# Patient Record
Sex: Female | Born: 1985 | Race: White | Hispanic: No | Marital: Married | Smoking: Current every day smoker
Health system: Southern US, Community
[De-identification: ages and names within clinical notes are randomized; demographics above are authoritative.]

## PROBLEM LIST (undated history)

## (undated) ENCOUNTER — Inpatient Hospital Stay: Payer: Self-pay

## (undated) DIAGNOSIS — K219 Gastro-esophageal reflux disease without esophagitis: Secondary | ICD-10-CM

## (undated) DIAGNOSIS — G2581 Restless legs syndrome: Secondary | ICD-10-CM

---

## 2011-02-25 LAB — HM PAP SMEAR: HM Pap smear: NORMAL

## 2011-03-23 LAB — OB RESULTS CONSOLE ABO/RH

## 2011-03-23 LAB — OB RESULTS CONSOLE RUBELLA ANTIBODY, IGM: Rubella: IMMUNE

## 2011-03-23 LAB — OB RESULTS CONSOLE GC/CHLAMYDIA: Gonorrhea: NEGATIVE

## 2011-03-23 LAB — OB RESULTS CONSOLE ANTIBODY SCREEN: Antibody Screen: NEGATIVE

## 2011-08-25 ENCOUNTER — Encounter (HOSPITAL_COMMUNITY): Payer: Self-pay | Admitting: *Deleted

## 2011-08-25 ENCOUNTER — Telehealth (HOSPITAL_COMMUNITY): Payer: Self-pay | Admitting: *Deleted

## 2011-08-25 NOTE — Telephone Encounter (Signed)
Preadmission screen  

## 2011-08-27 ENCOUNTER — Encounter (HOSPITAL_COMMUNITY): Payer: Self-pay | Admitting: Anesthesiology

## 2011-08-27 ENCOUNTER — Encounter (HOSPITAL_COMMUNITY)
Admission: AD | Disposition: A | Discharge status: Home / Self Care | Payer: Self-pay | Source: Ambulatory Visit | Attending: Obstetrics and Gynecology

## 2011-08-27 ENCOUNTER — Encounter (HOSPITAL_COMMUNITY): Payer: Self-pay | Admitting: *Deleted

## 2011-08-27 ENCOUNTER — Encounter (HOSPITAL_COMMUNITY): Payer: Self-pay

## 2011-08-27 ENCOUNTER — Inpatient Hospital Stay (HOSPITAL_COMMUNITY): Payer: Medicaid Other | Admitting: Anesthesiology

## 2011-08-27 ENCOUNTER — Inpatient Hospital Stay (HOSPITAL_COMMUNITY)
Admission: AD | Admit: 2011-08-27 | Discharge: 2011-08-30 | DRG: 766 | Disposition: A | Discharge status: Home / Self Care | Payer: Medicaid Other | Source: Ambulatory Visit | Attending: Obstetrics and Gynecology | Admitting: Obstetrics and Gynecology

## 2011-08-27 DIAGNOSIS — Z98891 History of uterine scar from previous surgery: Secondary | ICD-10-CM

## 2011-08-27 LAB — CBC
HCT: 32.6 % — ABNORMAL LOW (ref 36.0–46.0)
MCH: 29.4 pg (ref 26.0–34.0)
MCV: 87.2 fL (ref 78.0–100.0)
Platelets: 249 10*3/uL (ref 150–400)
RBC: 3.74 MIL/uL — ABNORMAL LOW (ref 3.87–5.11)
WBC: 15.9 10*3/uL — ABNORMAL HIGH (ref 4.0–10.5)

## 2011-08-27 LAB — TYPE AND SCREEN
ABO/RH(D): B POS
Antibody Screen: NEGATIVE

## 2011-08-27 SURGERY — Surgical Case
Anesthesia: Regional

## 2011-08-27 MED ORDER — OXYTOCIN 10 UNIT/ML IJ SOLN
INTRAMUSCULAR | Status: DC | PRN
  Administered 2011-08-27 (×2): 40 [IU] via INTRAMUSCULAR

## 2011-08-27 MED ORDER — ONDANSETRON HCL 4 MG/2ML IJ SOLN
INTRAMUSCULAR | Status: DC | PRN
  Administered 2011-08-27: 4 mg via INTRAVENOUS

## 2011-08-27 MED ORDER — PHENYLEPHRINE 40 MCG/ML (10ML) SYRINGE FOR IV PUSH (FOR BLOOD PRESSURE SUPPORT)
PREFILLED_SYRINGE | INTRAVENOUS | Status: AC
  Filled 2011-08-27: qty 5

## 2011-08-27 MED ORDER — FENTANYL CITRATE 0.05 MG/ML IJ SOLN: 25.0000 ug | INTRAMUSCULAR | Status: DC | PRN

## 2011-08-27 MED ORDER — IBUPROFEN 600 MG PO TABS: 600.0000 mg | ORAL_TABLET | Freq: Four times a day (QID) | ORAL | Status: DC | PRN

## 2011-08-27 MED ORDER — NALOXONE HCL 0.4 MG/ML IJ SOLN: 0.4000 mg | INTRAMUSCULAR | Status: DC | PRN

## 2011-08-27 MED ORDER — MORPHINE SULFATE 0.5 MG/ML IJ SOLN
INTRAMUSCULAR | Status: AC
  Filled 2011-08-27: qty 10

## 2011-08-27 MED ORDER — FENTANYL 2.5 MCG/ML BUPIVACAINE 1/10 % EPIDURAL INFUSION (WH - ANES)
INTRAMUSCULAR | Status: AC
  Filled 2011-08-27: qty 60

## 2011-08-27 MED ORDER — ACETAMINOPHEN 325 MG PO TABS: 650.0000 mg | ORAL_TABLET | ORAL | Status: DC | PRN

## 2011-08-27 MED ORDER — OXYCODONE-ACETAMINOPHEN 5-325 MG PO TABS
1.0000 | ORAL_TABLET | ORAL | Status: DC | PRN
  Administered 2011-08-29 – 2011-08-30 (×3): 1 via ORAL
  Filled 2011-08-27 (×3): qty 1

## 2011-08-27 MED ORDER — SCOPOLAMINE 1 MG/3DAYS TD PT72
MEDICATED_PATCH | TRANSDERMAL | Status: AC
  Administered 2011-08-27: 1.5 mg via TRANSDERMAL
  Filled 2011-08-27: qty 1

## 2011-08-27 MED ORDER — ONDANSETRON HCL 4 MG PO TABS: 4.0000 mg | ORAL_TABLET | ORAL | Status: DC | PRN

## 2011-08-27 MED ORDER — DIPHENHYDRAMINE HCL 50 MG/ML IJ SOLN: 25.0000 mg | INTRAMUSCULAR | Status: DC | PRN

## 2011-08-27 MED ORDER — LACTATED RINGERS IV SOLN
INTRAVENOUS | Status: DC | PRN
  Administered 2011-08-27: 20:00:00 via INTRAVENOUS

## 2011-08-27 MED ORDER — OXYCODONE-ACETAMINOPHEN 5-325 MG PO TABS: 1.0000 | ORAL_TABLET | ORAL | Status: DC | PRN

## 2011-08-27 MED ORDER — PHENYLEPHRINE 40 MCG/ML (10ML) SYRINGE FOR IV PUSH (FOR BLOOD PRESSURE SUPPORT): 80.0000 ug | PREFILLED_SYRINGE | INTRAVENOUS | Status: DC | PRN

## 2011-08-27 MED ORDER — METOCLOPRAMIDE HCL 5 MG/ML IJ SOLN: 10.0000 mg | Freq: Three times a day (TID) | INTRAMUSCULAR | Status: DC | PRN

## 2011-08-27 MED ORDER — FENTANYL 2.5 MCG/ML BUPIVACAINE 1/10 % EPIDURAL INFUSION (WH - ANES)
14.0000 mL/h | INTRAMUSCULAR | Status: DC
  Administered 2011-08-27: 14 mL/h via EPIDURAL
  Filled 2011-08-27: qty 60

## 2011-08-27 MED ORDER — LIDOCAINE HCL (PF) 1 % IJ SOLN
INTRAMUSCULAR | Status: DC | PRN
  Administered 2011-08-27 (×2): 4 mL

## 2011-08-27 MED ORDER — OXYTOCIN 10 UNIT/ML IJ SOLN
INTRAMUSCULAR | Status: AC
  Filled 2011-08-27: qty 4

## 2011-08-27 MED ORDER — LACTATED RINGERS IV SOLN
INTRAVENOUS | Status: DC
  Administered 2011-08-27: 20:00:00 via INTRAVENOUS

## 2011-08-27 MED ORDER — IBUPROFEN 600 MG PO TABS
600.0000 mg | ORAL_TABLET | Freq: Four times a day (QID) | ORAL | Status: DC | PRN
  Filled 2011-08-27 (×6): qty 1

## 2011-08-27 MED ORDER — SODIUM CHLORIDE 0.9 % IV SOLN
1.0000 ug/kg/h | INTRAVENOUS | Status: DC | PRN
  Filled 2011-08-27: qty 2.5

## 2011-08-27 MED ORDER — NALBUPHINE HCL 10 MG/ML IJ SOLN
5.0000 mg | INTRAMUSCULAR | Status: DC | PRN
  Filled 2011-08-27 (×2): qty 1

## 2011-08-27 MED ORDER — EPHEDRINE 5 MG/ML INJ: 10.0000 mg | INTRAVENOUS | Status: DC | PRN

## 2011-08-27 MED ORDER — FENTANYL 2.5 MCG/ML BUPIVACAINE 1/10 % EPIDURAL INFUSION (WH - ANES)
INTRAMUSCULAR | Status: DC | PRN
  Administered 2011-08-27: 14 mL/h via EPIDURAL

## 2011-08-27 MED ORDER — DIBUCAINE 1 % RE OINT: 1.0000 "application " | TOPICAL_OINTMENT | RECTAL | Status: DC | PRN

## 2011-08-27 MED ORDER — DIPHENHYDRAMINE HCL 25 MG PO CAPS: 25.0000 mg | ORAL_CAPSULE | Freq: Four times a day (QID) | ORAL | Status: DC | PRN

## 2011-08-27 MED ORDER — FENTANYL CITRATE 0.05 MG/ML IJ SOLN
INTRAMUSCULAR | Status: DC | PRN
  Administered 2011-08-27: 100 ug via INTRAVENOUS

## 2011-08-27 MED ORDER — IBUPROFEN 600 MG PO TABS
600.0000 mg | ORAL_TABLET | Freq: Four times a day (QID) | ORAL | Status: DC
  Administered 2011-08-28 – 2011-08-30 (×9): 600 mg via ORAL
  Filled 2011-08-27 (×3): qty 1

## 2011-08-27 MED ORDER — DIPHENHYDRAMINE HCL 50 MG/ML IJ SOLN: 12.5000 mg | INTRAMUSCULAR | Status: DC | PRN

## 2011-08-27 MED ORDER — LACTATED RINGERS IV SOLN
500.0000 mL | INTRAVENOUS | Status: DC | PRN
Start: 2011-08-27 — End: 2011-08-27
  Administered 2011-08-27: 500 mL via INTRAVENOUS

## 2011-08-27 MED ORDER — KETOROLAC TROMETHAMINE 30 MG/ML IJ SOLN: 30.0000 mg | Freq: Four times a day (QID) | INTRAMUSCULAR | Status: AC | PRN

## 2011-08-27 MED ORDER — PHENYLEPHRINE HCL 10 MG/ML IJ SOLN
INTRAMUSCULAR | Status: DC | PRN
  Administered 2011-08-27 (×2): 40 ug via INTRAVENOUS

## 2011-08-27 MED ORDER — WITCH HAZEL-GLYCERIN EX PADS: 1.0000 "application " | MEDICATED_PAD | CUTANEOUS | Status: DC | PRN

## 2011-08-27 MED ORDER — ONDANSETRON HCL 4 MG/2ML IJ SOLN: 4.0000 mg | INTRAMUSCULAR | Status: DC | PRN

## 2011-08-27 MED ORDER — KETOROLAC TROMETHAMINE 30 MG/ML IJ SOLN
30.0000 mg | Freq: Four times a day (QID) | INTRAMUSCULAR | Status: AC | PRN
  Administered 2011-08-28: 30 mg via INTRAVENOUS
  Filled 2011-08-27: qty 1

## 2011-08-27 MED ORDER — ONDANSETRON HCL 4 MG/2ML IJ SOLN: 4.0000 mg | Freq: Three times a day (TID) | INTRAMUSCULAR | Status: DC | PRN

## 2011-08-27 MED ORDER — SIMETHICONE 80 MG PO CHEW
80.0000 mg | CHEWABLE_TABLET | Freq: Three times a day (TID) | ORAL | Status: DC
  Administered 2011-08-28 – 2011-08-30 (×9): 80 mg via ORAL

## 2011-08-27 MED ORDER — FLEET ENEMA 7-19 GM/118ML RE ENEM: 1.0000 | ENEMA | RECTAL | Status: DC | PRN

## 2011-08-27 MED ORDER — TERBUTALINE SULFATE 1 MG/ML IJ SOLN: 0.2500 mg | Freq: Once | INTRAMUSCULAR | Status: DC | PRN

## 2011-08-27 MED ORDER — ZOLPIDEM TARTRATE 5 MG PO TABS: 5.0000 mg | ORAL_TABLET | Freq: Every evening | ORAL | Status: DC | PRN

## 2011-08-27 MED ORDER — SCOPOLAMINE 1 MG/3DAYS TD PT72
1.0000 | MEDICATED_PATCH | Freq: Once | TRANSDERMAL | Status: DC
  Administered 2011-08-27: 1.5 mg via TRANSDERMAL

## 2011-08-27 MED ORDER — LIDOCAINE HCL (PF) 1 % IJ SOLN: 30.0000 mL | INTRAMUSCULAR | Status: DC | PRN

## 2011-08-27 MED ORDER — KETOROLAC TROMETHAMINE 60 MG/2ML IM SOLN
60.0000 mg | Freq: Once | INTRAMUSCULAR | Status: AC | PRN
  Administered 2011-08-27: 60 mg via INTRAMUSCULAR

## 2011-08-27 MED ORDER — LACTATED RINGERS IV SOLN: 500.0000 mL | Freq: Once | INTRAVENOUS | Status: DC

## 2011-08-27 MED ORDER — EPHEDRINE 5 MG/ML INJ
INTRAVENOUS | Status: AC
  Filled 2011-08-27: qty 4

## 2011-08-27 MED ORDER — SIMETHICONE 80 MG PO CHEW: 80.0000 mg | CHEWABLE_TABLET | ORAL | Status: DC | PRN

## 2011-08-27 MED ORDER — KETOROLAC TROMETHAMINE 60 MG/2ML IM SOLN
INTRAMUSCULAR | Status: AC
  Administered 2011-08-27: 60 mg via INTRAMUSCULAR
  Filled 2011-08-27: qty 2

## 2011-08-27 MED ORDER — LANOLIN HYDROUS EX OINT: 1.0000 "application " | TOPICAL_OINTMENT | CUTANEOUS | Status: DC | PRN

## 2011-08-27 MED ORDER — PRENATAL MULTIVITAMIN CH
1.0000 | ORAL_TABLET | Freq: Every day | ORAL | Status: DC
  Administered 2011-08-28 – 2011-08-30 (×3): 1 via ORAL
  Filled 2011-08-27 (×3): qty 1

## 2011-08-27 MED ORDER — MORPHINE SULFATE (PF) 0.5 MG/ML IJ SOLN
INTRAMUSCULAR | Status: DC | PRN
  Administered 2011-08-27: 4 mg via EPIDURAL
  Administered 2011-08-27: 1 mg via INTRAVENOUS

## 2011-08-27 MED ORDER — CITRIC ACID-SODIUM CITRATE 334-500 MG/5ML PO SOLN
ORAL | Status: AC
  Filled 2011-08-27: qty 15

## 2011-08-27 MED ORDER — MEPERIDINE HCL 25 MG/ML IJ SOLN: 6.2500 mg | INTRAMUSCULAR | Status: DC | PRN

## 2011-08-27 MED ORDER — MEPERIDINE HCL 25 MG/ML IJ SOLN
INTRAMUSCULAR | Status: DC | PRN
  Administered 2011-08-27: 25 mg via INTRAVENOUS

## 2011-08-27 MED ORDER — SODIUM CHLORIDE 0.9 % IJ SOLN: 3.0000 mL | INTRAMUSCULAR | Status: DC | PRN

## 2011-08-27 MED ORDER — MENTHOL 3 MG MT LOZG: 1.0000 | LOZENGE | OROMUCOSAL | Status: DC | PRN

## 2011-08-27 MED ORDER — DIPHENHYDRAMINE HCL 25 MG PO CAPS: 25.0000 mg | ORAL_CAPSULE | ORAL | Status: DC | PRN

## 2011-08-27 MED ORDER — OXYTOCIN BOLUS FROM INFUSION
250.0000 mL | Freq: Once | INTRAVENOUS | Status: DC
  Filled 2011-08-27: qty 500

## 2011-08-27 MED ORDER — TETANUS-DIPHTH-ACELL PERTUSSIS 5-2.5-18.5 LF-MCG/0.5 IM SUSP
0.5000 mL | Freq: Once | INTRAMUSCULAR | Status: AC
  Administered 2011-08-28: 0.5 mL via INTRAMUSCULAR
  Filled 2011-08-27: qty 0.5

## 2011-08-27 MED ORDER — NALBUPHINE HCL 10 MG/ML IJ SOLN
5.0000 mg | INTRAMUSCULAR | Status: DC | PRN
  Administered 2011-08-28 (×6): 5 mg via INTRAVENOUS
  Filled 2011-08-27 (×4): qty 1

## 2011-08-27 MED ORDER — FENTANYL CITRATE 0.05 MG/ML IJ SOLN
INTRAMUSCULAR | Status: AC
  Filled 2011-08-27: qty 2

## 2011-08-27 MED ORDER — OXYTOCIN 40 UNITS IN LACTATED RINGERS INFUSION - SIMPLE MED
1.0000 m[IU]/min | INTRAVENOUS | Status: DC
  Administered 2011-08-27: 2 m[IU]/min via INTRAVENOUS

## 2011-08-27 MED ORDER — MEPERIDINE HCL 25 MG/ML IJ SOLN
INTRAMUSCULAR | Status: AC
  Filled 2011-08-27: qty 1

## 2011-08-27 MED ORDER — LACTATED RINGERS IV BOLUS (SEPSIS)
1000.0000 mL | Freq: Once | INTRAVENOUS | Status: AC
  Administered 2011-08-27: 1000 mL via INTRAVENOUS

## 2011-08-27 MED ORDER — OXYTOCIN 20 UNITS IN LACTATED RINGERS INFUSION - SIMPLE
INTRAVENOUS | Status: DC | PRN
  Administered 2011-08-27 (×2): 40 [IU] via INTRAVENOUS

## 2011-08-27 MED ORDER — CITRIC ACID-SODIUM CITRATE 334-500 MG/5ML PO SOLN
30.0000 mL | ORAL | Status: DC | PRN
  Administered 2011-08-27: 30 mL via ORAL

## 2011-08-27 MED ORDER — OXYTOCIN 40 UNITS IN LACTATED RINGERS INFUSION - SIMPLE MED
62.5000 mL/h | Freq: Once | INTRAVENOUS | Status: DC
  Filled 2011-08-27: qty 1000

## 2011-08-27 MED ORDER — SENNOSIDES-DOCUSATE SODIUM 8.6-50 MG PO TABS
2.0000 | ORAL_TABLET | Freq: Every day | ORAL | Status: DC
  Administered 2011-08-28 – 2011-08-29 (×2): 2 via ORAL

## 2011-08-27 MED ORDER — OXYTOCIN 40 UNITS IN LACTATED RINGERS INFUSION - SIMPLE MED: 62.5000 mL/h | INTRAVENOUS | Status: AC

## 2011-08-27 MED ORDER — SODIUM BICARBONATE 8.4 % IV SOLN
INTRAVENOUS | Status: DC | PRN
  Administered 2011-08-27: 5 mL via EPIDURAL

## 2011-08-27 MED ORDER — LACTATED RINGERS IV SOLN
INTRAVENOUS | Status: DC
  Administered 2011-08-28: 06:00:00 via INTRAVENOUS

## 2011-08-27 MED ORDER — ONDANSETRON HCL 4 MG/2ML IJ SOLN: 4.0000 mg | Freq: Four times a day (QID) | INTRAMUSCULAR | Status: DC | PRN

## 2011-08-27 MED ORDER — CEFAZOLIN SODIUM-DEXTROSE 2-3 GM-% IV SOLR
2.0000 g | Freq: Once | INTRAVENOUS | Status: AC
  Administered 2011-08-27: 2 g via INTRAVENOUS
  Filled 2011-08-27: qty 50

## 2011-08-27 SURGICAL SUPPLY — 28 items
CHLORAPREP W/TINT 26ML (MISCELLANEOUS) ×2 IMPLANT
CLOTH BEACON ORANGE TIMEOUT ST (SAFETY) ×2 IMPLANT
CONTAINER PREFILL 10% NBF 15ML (MISCELLANEOUS) IMPLANT
ELECT REM PT RETURN 9FT ADLT (ELECTROSURGICAL) ×2
ELECTRODE REM PT RTRN 9FT ADLT (ELECTROSURGICAL) ×1 IMPLANT
EXTRACTOR VACUUM KIWI (MISCELLANEOUS) IMPLANT
EXTRACTOR VACUUM M CUP 4 TUBE (SUCTIONS) IMPLANT
GLOVE BIO SURGEON STRL SZ 6.5 (GLOVE) ×4 IMPLANT
GLOVE BIO SURGEON STRL SZ8 (GLOVE) ×2 IMPLANT
GOWN PREVENTION PLUS LG XLONG (DISPOSABLE) ×4 IMPLANT
KIT ABG SYR 3ML LUER SLIP (SYRINGE) IMPLANT
NEEDLE HYPO 25X5/8 SAFETYGLIDE (NEEDLE) ×2 IMPLANT
NS IRRIG 1000ML POUR BTL (IV SOLUTION) ×2 IMPLANT
PACK C SECTION WH (CUSTOM PROCEDURE TRAY) ×2 IMPLANT
RTRCTR C-SECT PINK 25CM LRG (MISCELLANEOUS) ×2 IMPLANT
SLEEVE SCD COMPRESS KNEE MED (MISCELLANEOUS) ×2 IMPLANT
STAPLER VISISTAT 35W (STAPLE) IMPLANT
SUT CHROMIC 1 CTX 36 (SUTURE) ×4 IMPLANT
SUT PLAIN 0 NONE (SUTURE) IMPLANT
SUT PLAIN 2 0 XLH (SUTURE) IMPLANT
SUT VIC AB 0 CT1 27 (SUTURE) ×2
SUT VIC AB 0 CT1 27XBRD ANBCTR (SUTURE) ×2 IMPLANT
SUT VIC AB 2-0 CT1 27 (SUTURE) ×1
SUT VIC AB 2-0 CT1 TAPERPNT 27 (SUTURE) ×1 IMPLANT
SUT VIC AB 4-0 KS 27 (SUTURE) ×2 IMPLANT
TOWEL OR 17X24 6PK STRL BLUE (TOWEL DISPOSABLE) ×4 IMPLANT
TRAY FOLEY CATH 14FR (SET/KITS/TRAYS/PACK) ×2 IMPLANT
WATER STERILE IRR 1000ML POUR (IV SOLUTION) ×2 IMPLANT

## 2011-08-27 NOTE — H&P (Signed)
Tammy Leon is a 26 y.o. female G1P0 at 35 2/7 weeks (EDD 08/25/11 by 17 week [place]) presenting for early labor because presented to MAU and had contractions with a possible spontaneous decel noted after walking.  FHR improved to baseline 120 with good variability with fluids and position change.  Cervix not changing very much with contractions, but given decel will stay and have labor augmented.  PNC uncomplicated except late start at 17 weeks and smoker status, did cut down to 5cigs or less a day.  Maternal Medical History:  Reason for admission: Reason for admission: contractions.  Contractions: Onset was 3-5 hours ago.   Frequency: regular.   Perceived severity is moderate.      OB History    Grav Para Term Preterm Abortions TAB SAB Ect Mult Living   1              Past Medical History  Diagnosis Date  . No pertinent past medical history    Past Surgical History  Procedure Date  . No past surgeries    Family History: family history includes Cancer in her paternal grandmother; Diabetes in her father, maternal grandmother, and paternal grandmother; and Hypertension in her paternal grandfather.  There is no history of Other. Social History:  reports that she has been smoking Cigarettes.  She has been smoking about .2 packs per day. She has never used smokeless tobacco. She reports that she does not drink alcohol or use illicit drugs.   Prenatal Transfer Tool  Maternal Diabetes: No Genetic Screening: Normal Maternal Ultrasounds/Referrals: Normal Fetal Ultrasounds or other Referrals:  None Maternal Substance Abuse:  Yes:  Type: Smoker Significant Maternal Medications:  None Significant Maternal Lab Results:  None Other Comments:  None  ROS  Dilation: 3.5 Effacement (%): 70 Station: -3 Exam by:: SBeck, RN Blood pressure 130/67, pulse 90, temperature 98.1 F (36.7 C), temperature source Oral, resp. rate 20, height 5\' 2"  (1.575 m), weight 83.008 kg (183 lb), last menstrual  period 12/03/2010, SpO2 100.00%. Maternal Exam:  Uterine Assessment: Contraction strength is moderate.  Contraction frequency is regular.   Abdomen: Patient reports no abdominal tenderness. Fetal presentation: vertex  Introitus: Normal vulva. Normal vagina.    Physical Exam  Constitutional: She is oriented to person, place, and time. She appears well-developed and well-nourished.  Cardiovascular: Normal rate and regular rhythm.   Respiratory: Effort normal and breath sounds normal.  GI: Soft. Bowel sounds are normal.  Genitourinary: Vagina normal and uterus normal.  Neurological: She is alert and oriented to person, place, and time.  Psychiatric: She has a normal mood and affect. Her behavior is normal.    Prenatal labs: ABO, Rh: B/Positive/-- (01/28 0000) Antibody: Negative (01/28 0000) Rubella: Immune (01/28 0000) RPR: Nonreactive (01/28 0000)  HBsAg: Negative (01/28 0000)  HIV: Non-reactive (01/28 0000)  GBS: Negative (06/11 0000)  Quad screen WNL One hour GTT WNL  Assessment/Plan: Pt withuncomfortable contractions and spontaneous decel, will admit and allow epidural then plan internal monitoring and augmentation as needed.  Oliver Pila 08/27/2011, 2:31 PM

## 2011-08-27 NOTE — Progress Notes (Signed)
Dr. Senaida Ores discussed risks and benefits of c-section, pt verbalized understanding, consent signed

## 2011-08-27 NOTE — Progress Notes (Signed)
Dr. Senaida Ores notified of decel and recovery, will procede with plan to get epidural.

## 2011-08-27 NOTE — Brief Op Note (Signed)
08/27/2011  9:24 PM  PATIENT:  Tammy Leon  26 y.o. female  PRE-OPERATIVE DIAGNOSIS:  Non-Reassuring Fetal Heart Rate  POST-OPERATIVE DIAGNOSIS:  Non-Reassuring Fetal Heart Rate  PROCEDURE:  Procedure(s) (LRB): Primary Low Transverse CESAREAN SECTION (N/A) With two layer closure of uterus  SURGEON:  Surgeon(s) and Role:    * Oliver Pila, MD - Primary   ANESTHESIA:   epidural  EBL:  Total I/O In: 1500 [I.V.:1500] Out: 700 [Urine:100; Blood:600]  BLOOD ADMINISTERED:none  DRAINS: Urinary Catheter (Foley)   LOCAL MEDICATIONS USED:  NONE  SPECIMEN: Placenta  DISPOSITION OF SPECIMEN:  L&D  COUNTS:  YES  TOURNIQUET:  * No tourniquets in log *  DICTATION: .Dragon Dictation  PLAN OF CARE: Admit to inpatient   PATIENT DISPOSITION:  PACU - hemodynamically stable.

## 2011-08-27 NOTE — Progress Notes (Signed)
Dr. Senaida Ores notified of decel, interventions, and recovery. Pt right tilt

## 2011-08-27 NOTE — MAU Note (Signed)
Pt remains on left lateral side, bolus infusing, o2 remains on at 10l/min via non-rebreather.

## 2011-08-27 NOTE — Anesthesia Preprocedure Evaluation (Signed)
Anesthesia Evaluation  Patient identified by MRN, date of birth, ID band Patient awake    Reviewed: Allergy & Precautions, H&P , Patient's Chart, lab work & pertinent test results  Airway Mallampati: III TM Distance: >3 FB Neck ROM: full    Dental No notable dental hx. (+) Teeth Intact   Pulmonary neg pulmonary ROS, Current Smoker,  breath sounds clear to auscultation  Pulmonary exam normal       Cardiovascular negative cardio ROS  Rhythm:regular Rate:Normal     Neuro/Psych negative neurological ROS  negative psych ROS   GI/Hepatic negative GI ROS, Neg liver ROS,   Endo/Other  negative endocrine ROS  Renal/GU negative Renal ROS  negative genitourinary   Musculoskeletal   Abdominal Normal abdominal exam  (+)   Peds  Hematology negative hematology ROS (+)   Anesthesia Other Findings   Reproductive/Obstetrics (+) Pregnancy                           Anesthesia Physical Anesthesia Plan  ASA: II  Anesthesia Plan: Epidural   Post-op Pain Management:    Induction:   Airway Management Planned:   Additional Equipment:   Intra-op Plan:   Post-operative Plan:   Informed Consent: I have reviewed the patients History and Physical, chart, labs and discussed the procedure including the risks, benefits and alternatives for the proposed anesthesia with the patient or authorized representative who has indicated his/her understanding and acceptance.     Plan Discussed with: Anesthesiologist  Anesthesia Plan Comments:         Anesthesia Quick Evaluation

## 2011-08-27 NOTE — MAU Note (Signed)
Pt states having ctx's since yesterday, now q40minutes apart. Denies bleeding, notes yellow vaginal discharge in increased amount. Noted since beginning of pregnancy. Was checked Tuesday 07/02 by Dr. Jackelyn Knife, cervix 3cm.

## 2011-08-27 NOTE — MAU Note (Signed)
Dr. Senaida Ores notified of pt in MAU for labor eval, cervical exam 3.5/70/-3 vertex, intact, ctx's q2-4 minutes apart. EFM tracing reactive. Orders to walk x1hour, then recheck pt's cervix and call with results.

## 2011-08-27 NOTE — Progress Notes (Signed)
Patient ID: Tammy Leon, female   DOB: 12-Sep-1985, 26 y.o.   MRN: [id number] CTSP for spontaneous decel to 80's  FHR has continued to have intermittent spontaneous decels with tracing looking reassuring in between.  This last decel lasted 6-7 minutes before recovery which required knee/chest position and O2. Cervix still 80/5/-1 to 0  Her contractions have not been fully adequate, but pitocin has been started and stopped due to the tracing.  D/w the patient the situation in detail and that if the tracing continues to demonstrate intolerance of contractions we will need to proceed with c-section.  We discussed the c-section procedure in detail including risks of bleeding, infection, and possible damage to bowel and bladder.  Pt expresses understanding.  Will try to restart pitocin now that tracing recovered, and follow closely.  If further significant decels will proceed with c-section.  Tracing currently Category 2.

## 2011-08-27 NOTE — Consult Note (Signed)
Neonatology Note:   Attendance at C-section:    I was asked to attend this primary C/S at term due to fetal intolerance to labor/Pitocin. The mother is a G1P0 B pos, GBS neg with meconium-stained fluid. ROM 5.5 hours prior to delivery, fluid with thick meconium. Infant vigorous with good spontaneous cry and tone. Needed only minimal bulb suctioning. Ap 9/9. Lungs clear to ausc in DR. To CN to care of Pediatrician.   Deatra James, MD

## 2011-08-27 NOTE — Progress Notes (Signed)
Patient ID: Tammy Leon, female   DOB: 1985-12-06, 26 y.o.   MRN: [id number] CTSP for another decel to 70-80 for 4-5 minutes  Baby responded to stopping pitocin and knee chest position 80/7/0  D/w pt in detail and she desires to proceed with C/S for non-reassuring FHR. OR ready and will go back shortly.  FHR currently recovered and 130

## 2011-08-27 NOTE — Transfer of Care (Signed)
Immediate Anesthesia Transfer of Care Note  Patient: Tammy Leon  Procedure(s) Performed: Procedure(s) (LRB): CESAREAN SECTION (N/A)  Patient Location: PACU  Anesthesia Type: Epidural  Level of Consciousness: awake, alert  and oriented  Airway & Oxygen Therapy: Patient Spontanous Breathing  Post-op Assessment: Report given to PACU RN and Post -op Vital signs reviewed and stable  Post vital signs: Reviewed and stable  Complications: No apparent anesthesia complications

## 2011-08-27 NOTE — Anesthesia Postprocedure Evaluation (Signed)
Anesthesia Post Note  Patient: Tammy Leon  Procedure(s) Performed: Procedure(s) (LRB): CESAREAN SECTION (N/A)  Anesthesia type: Epidural  Patient location: PACU  Post pain: Pain level controlled  Post assessment: Post-op Vital signs reviewed  Last Vitals:  Filed Vitals:   08/27/11 2230  BP: 112/76  Pulse: 108  Temp: 37.2 C  Resp: 16    Post vital signs: stable  Level of consciousness: awake  Complications: No apparent anesthesia complications

## 2011-08-27 NOTE — Progress Notes (Signed)
Patient ID: Tammy Leon, female   DOB: 1985/12/13, 26 y.o.   MRN: [id number] Pt now admitted and received epidural FHR had one more spontaneous decel to 90's but recovered quickly after 2-3 minutes and currently reactive with good variability Cervix 75/4/-1  Pt had SROM in last 30 minutes, moderate meconium IUPC and FSE applied to follow FHR closely, will augment with pitocin as needed, ctx do not appear adequate.

## 2011-08-27 NOTE — Anesthesia Procedure Notes (Signed)
Epidural Patient location during procedure: OB Start time: 08/27/2011 3:09 PM  Staffing Anesthesiologist: Gaelan Glennon A.  Preanesthetic Checklist Completed: patient identified, site marked, surgical consent, pre-op evaluation, timeout performed, IV checked, risks and benefits discussed and monitors and equipment checked  Epidural Patient position: sitting Prep: site prepped and draped and DuraPrep Patient monitoring: continuous pulse ox and blood pressure Approach: midline Injection technique: LOR air  Needle:  Needle type: Tuohy  Needle gauge: 17 G Needle length: 9 cm Needle insertion depth: 7 cm Catheter type: closed end flexible Catheter size: 19 Gauge Catheter at skin depth: 12 cm Test dose: negative and Other  Assessment Events: blood not aspirated, injection not painful, no injection resistance, negative IV test and no paresthesia  Additional Notes Patient identified. Risks and benefits discussed including failed block, incomplete  Pain control, post dural puncture headache, nerve damage, paralysis, blood pressure Changes, nausea, vomiting, reactions to medications-both toxic and allergic and post Partum back pain. All questions were answered. Patient expressed understanding and wished to proceed. Sterile technique was used throughout procedure. Epidural site was Dressed with sterile barrier dressing. No paresthesias, signs of intravascular injection Or signs of intrathecal spread were encountered.  Patient was more comfortable after the epidural was dosed. Please see RN's note for documentation of vital signs and FHR which are stable.

## 2011-08-27 NOTE — Op Note (Signed)
Operative note  Preoperative diagnosis Term pregnancy at 40-2/7 weeks Nonreassuring fetal heart rate Moderate meconium stained fluid  Postoperative diagnosis Same  Procedure Primary low transverse C-section with 2 layer closure of uterus  Surgeon Dr. Huel Cote  Anesthesia Epidural  Fluids Estimated blood loss 700 cc Urine output 150 cc clearing urine IV fluids 2200 cc LR  Findings A viable female infant in the vertex presentation, Apgars were 8 and 9, weight pending at time of dictation There is normal uterus ovaries and tubes noted  Specimen Placenta sent to L&D  Procedure note Patient was counseled and her birthing suite room and informed consent obtained for proceeding with cesarean section. She was then taken to the operating room where epidural anesthesia was found to be adequate by Allis clamp test.  An appropriate time out was performed and she was prepped and draped in the normal sterile fashion in the dorsal supine position with a leftward tilt.  A Pfannenstiel skin incision was then made carried through to underlying layer of fascia by sharp dissection and Bovie cautery. The fascia was then nicked in the midline and the incision was extended laterally with Mayo scissors. The inferior aspect of the incision was then grasped with Coker clamps elevated and dissected off the underlying rectus muscles.  In a similar fashion the superior aspect was dissected off the  rectus muscles which were separated in the midline and the peritoneal cavity entered bluntly. The peritoneal incision was then extended both superiorly and inferiorly were careful attention to avoid both bowel and bladder. The Alexis self-retaining wound retractor was then placed within the incision and the lower uterine segment exposed. The lower uterine segment was then incised in a transverse fashion and the uterine cavity itself entered bluntly. Moderately meconium-stained fluid was noted and infant was in  the OP presentation. The infant's head was delivered without difficulty and the nose and mouth bulb suctioned with a good spontaneous cry immediately after delivery of the remainder of the body. The cord was clamped and cut and infant was handed to the waiting pediatricians. The placenta was then expressed spontaneously and the uterus cleared of all clots and debris with moist lap sponge. The uterine incision was then closed in 2 layers the first a running locked layer 1-0 chromic and the second an imbricating layer of the same suture. There was mild uterine atony which responded to Pitocin and bimanual massage. The gutters were cleared of all clots and debris the tubes and ovaries inspected and found to be normal. The peritoneum was then reapproximated with rectus muscles also in several interrupted mattress sutures of 2-0 Vicryl. The fascia was closed with 0 Vicryl in a running fashion. The subcutaneous tissue was reapproximated with 30 plain in a running fashion. The skin was then closed with 4-0 Vicryl in subcuticular stitch on a Keith needle. Steri-Strips and benzoin were placed and the dressing as well. All instruments and sponge counts were correct and the patient was taken to the recovery room in good condition. The baby accompanied the mother into the recovery room and was doing well.

## 2011-08-27 NOTE — MAU Note (Signed)
Late entry: Upon placing pt on monitors post walking, fhr found to be in 110's then over 2 minutes down to high forties, pt first placed on left lateral side, then on knees and elbows. FHR gradually increased back to 120's. Moderate variability. Pt then placed back onto her left lateral side while bolus was infusing and o2 still being applied. After orders were given, pt taken to [employer].

## 2011-08-28 ENCOUNTER — Encounter (HOSPITAL_COMMUNITY): Payer: Self-pay | Admitting: *Deleted

## 2011-08-28 LAB — CBC
HCT: 29.2 % — ABNORMAL LOW (ref 36.0–46.0)
Hemoglobin: 9.7 g/dL — ABNORMAL LOW (ref 12.0–15.0)
RBC: 3.33 MIL/uL — ABNORMAL LOW (ref 3.87–5.11)
WBC: 17.3 10*3/uL — ABNORMAL HIGH (ref 4.0–10.5)

## 2011-08-28 MED ORDER — PNEUMOCOCCAL VAC POLYVALENT 25 MCG/0.5ML IJ INJ
0.5000 mL | INJECTION | INTRAMUSCULAR | Status: AC
  Administered 2011-08-28: 0.5 mL via INTRAMUSCULAR
  Filled 2011-08-28: qty 0.5

## 2011-08-28 NOTE — Addendum Note (Signed)
Addendum  created 08/28/11 [id number] by Suella Grove, CRNA   Modules edited:Notes Section

## 2011-08-28 NOTE — Anesthesia Postprocedure Evaluation (Signed)
  Anesthesia Post-op Note  Patient: Tammy Leon  Procedure(s) Performed: Procedure(s) (LRB): CESAREAN SECTION (N/A)  Patient Location: Mother/Baby  Anesthesia Type: Epidural  Level of Consciousness: awake  Airway and Oxygen Therapy: Patient Spontanous Breathing  Post-op Pain: none  Post-op Assessment: Patient's Cardiovascular Status Stable, Respiratory Function Stable, Patent Airway, No signs of Nausea or vomiting, Adequate PO intake, Pain level controlled, No headache, No backache, No residual numbness and No residual motor weakness  Post-op Vital Signs: Reviewed and stable  Complications: No apparent anesthesia complications

## 2011-08-28 NOTE — Progress Notes (Signed)
Subjective: Postpartum Day 1 Cesarean Delivery Patient reports tolerating PO.  Foley catheter still in place.  Some itching but pain well-controlled.  Objective: Vital signs in last 24 hours: Temp:  [97.7 F (36.5 C)-99.5 F (37.5 C)] 98 F (36.7 C) (07/05 0814) Pulse Rate:  [78-113] 93  (07/05 0814) Resp:  [16-20] 18  (07/05 0814) BP: (92-158)/(51-76) 92/62 mmHg (07/05 0814) SpO2:  [92 %-100 %] 99 % (07/05 0814) Weight:  [83.008 kg (183 lb)] 83.008 kg (183 lb) (07/04 1142)  Physical Exam:  General: alert and cooperative Lochia: appropriate Uterine Fundus: firm Incision: C/D/I    Basename 08/28/11 0532 08/27/11 1420  HGB 9.7* 11.0*  HCT 29.2* 32.6*    Assessment/Plan: Status post Cesarean section. Doing well postoperatively.  Continue current care. D/w pt circumcision in detail.  She desires to proceed.  Partner to put baby on insurance today, will let cashier know.  Oliver Pila 08/28/2011, 8:59 AM

## 2011-08-28 NOTE — Progress Notes (Signed)
UR chart review completed.  

## 2011-08-29 NOTE — Progress Notes (Signed)
Patient ID: Tammy Leon, female   DOB: Oct 25, 1985, 26 y.o.   MRN: [id number] #2 afebrile tolerating a diet and passing flatus Voiding well

## 2011-08-30 MED ORDER — OXYCODONE-ACETAMINOPHEN 5-325 MG PO TABS: 1.0000 | ORAL_TABLET | Freq: Four times a day (QID) | ORAL | Status: AC | PRN

## 2011-08-30 MED ORDER — IBUPROFEN 600 MG PO TABS: 600.0000 mg | ORAL_TABLET | Freq: Four times a day (QID) | ORAL | Status: AC | PRN

## 2011-08-30 NOTE — Progress Notes (Signed)
Patient ID: Tammy Leon, female   DOB: 1985/09/26, 26 y.o.   MRN: [id number] #3 afebrile doing well for d/c

## 2011-08-31 ENCOUNTER — Inpatient Hospital Stay (HOSPITAL_COMMUNITY): Admission: RE | Admit: 2011-08-31 | Payer: Medicaid Other | Source: Ambulatory Visit

## 2011-08-31 NOTE — Discharge Summary (Signed)
NAMERHYLEIGH, GRASSEL              ACCOUNT NO.:  [id number]  MEDICAL RECORD NO.:  [id number]  LOCATION:  9104                          FACILITY:  [facility]  PHYSICIAN:  Malachi Pro. Ambrose Mantle, M.D. DATE OF BIRTH:  07-11-1985  DATE OF ADMISSION:  08/27/2011 DATE OF DISCHARGE:  08/30/2011                              DISCHARGE SUMMARY   This is a 26 year old white female, para 0, gravida 1 at 40-2/[redacted] weeks gestation, [facility] August 25, 2011 by 17 week ultrasound, presented for early labor.  She presented to the maternity admission unit and had contractions with a possible spontaneous deceleration noted after walking.  Fetal heart rate improved to baseline of 120 with variability with fluids and position change.  Because of the deceleration, Dr. Senaida Ores admitted her for induction of labor.  Prenatal course was complicated except for a late start at 17 weeks and smoking status, she claims she cut down to 5 cigarettes or less per day.  The patient had no pertinent past medical history.  She had no known allergies.  She had no past surgeries.  Family history included cancer in a paternal grandmother, diabetes in her father, maternal grandmother and paternal grandmother and hypertension in paternal grandfather.  The patient was smoking cigarettes.  She had been smoking about 0.2 packs per day.  She never used smokeless tobacco, did not drink alcohol or use illicit drugs.  She was B positive with a negative antibody, rubella immune, RPR nonreactive, hepatitis B surface antigen negative, HIV negative, GBS negative.  One hour Glucola was within normal limits and quad screen was normal.  At the time of admission, cervix was 3-1/2 cm, 70%, vertex at a -3.  Vital signs were normal.  The patient received an epidural.  The patient had 1 more spontaneous deceleration to the 90s but it recovered quickly after 2-3 minutes.  At 4:01 p.m., cervix was 4 cm, 75% effaced, vertex at a -1.  The patient had had spontaneous  rupture of membranes in the previous 30 minutes with moderate meconium stained fluid. Intrauterine pressure catheter and fetal scalp electrode were applied the follow the fetal heart rate closely.  The patient continued to have intermittent spontaneous deceleration's with the tracing looking reassuring in between.  Before 6:40 p.m., the patient had deceleration that lasted 6-7 minutes before recovery which required the knee-chest position and oxygen.  Cervix was still at 5 cm, 80%, vertex at a -1 to 0.  Pitocin had to be stopped and started secondary to the fetal heart rate tracing.  Dr. Senaida Ores discussed with the patient the fact that the fetal heart rate was interfering factor and that the patient proceed to a C-section.  At 8 p.m., Dr. Senaida Ores was called to see the patient for another deceleration to the 70-80, 4-5 minutes and the decision was made to proceed with a C-section at that point.  Low-transverse cervical C-section was done by Dr. Senaida Ores with a 2-layer closure.  Apgars of the infant were 8 and 9 at 1 and 5 minutes.  Uterus was normal as were the tubes and ovaries.  Postpartum, the patient did quite well, had return of normal bowel function, voided well without difficulty, remained afebrile,  and on the third postop day was ready for discharge.  LABORATORY DATA:  Initial hemoglobin 11.0, hematocrit 32.6, white count 15900, platelet count 249,000.  Followup hemoglobin 9.7, hematocrit 29.2.  RPR was nonreactive.  FINAL DIAGNOSES:  Intrauterine pregnancy at 40+ weeks, delivered by C- section, nonreassuring fetal heart rate pattern.  Failure to progress in labor.  OPERATION:  Low-transverse cervical C-section.  FINAL CONDITION:  Improved.  Instructions include our regular discharge instruction booklet as well as the after visit summary.  Motrin 600 mg, 30 tablets, 1 every 6 hours as needed for pain and Percocet 5/325, 30 tablets 1 every 6 hours as needed for pain  are given at discharge and the patient is advised to take ferrous sulfate 325 mg twice daily for several weeks.  She is to return to see Dr. Senaida Ores in 2 weeks and schedule a circumcision for the baby.     Malachi Pro. Ambrose Mantle, M.D.     TFH/MEDQ  D:  08/30/2011  T:  08/31/2011  Job:  [id number]

## 2011-11-28 ENCOUNTER — Emergency Department: Payer: Self-pay | Admitting: Emergency Medicine

## 2011-11-28 LAB — COMPREHENSIVE METABOLIC PANEL
Alkaline Phosphatase: 135 U/L (ref 50–136)
BUN: 11 mg/dL (ref 7–18)
Bilirubin,Total: 1.1 mg/dL — ABNORMAL HIGH (ref 0.2–1.0)
Creatinine: 0.93 mg/dL (ref 0.60–1.30)
EGFR (Non-African Amer.): >60
Glucose: 96 mg/dL (ref 65–99)
SGPT (ALT): 280 U/L — ABNORMAL HIGH (ref 12–78)
Total Protein: 7.8 g/dL (ref 6.4–8.2)

## 2011-11-28 LAB — CBC
HCT: 38.5 % (ref 35.0–47.0)
MCHC: 35.1 g/dL (ref 32.0–36.0)
RDW: 14.5 % (ref 11.5–14.5)

## 2011-11-28 LAB — URINALYSIS, COMPLETE
Bilirubin,UR: NEGATIVE
Blood: NEGATIVE
Leukocyte Esterase: NEGATIVE
Nitrite: NEGATIVE
Ph: 9 (ref 4.5–8.0)
Protein: NEGATIVE
RBC,UR: 2 /HPF (ref 0–5)
Squamous Epithelial: 3

## 2011-11-28 LAB — PREGNANCY, URINE: Pregnancy Test, Urine: NEGATIVE m[IU]/mL

## 2013-06-30 IMAGING — US ABDOMEN ULTRASOUND LIMITED
1 series · 14 of 25 positions shown · non-contrast
Comparison: none

REASON FOR EXAM: "epigastic fullness"  - elevated liver tests - eval for
biliary disease
COMMENTS:   Body Site: GB and Fossa, CBD, Head of Pancreas

PROCEDURE:     US  - US ABDOMEN LIMITED SURVEY  - November 28, 2011 [DATE]
RESULT:     Gallstones. Negative Murphy's sign. Gallbladder wall thickness
normal. Common bile duct diameter normal at 4.4 mm. Pancreatic head normal.

[Series 1: abdomen ultrasound limited · 0.28mm/px · 14 of 33 slices shown]
[im 1/33]
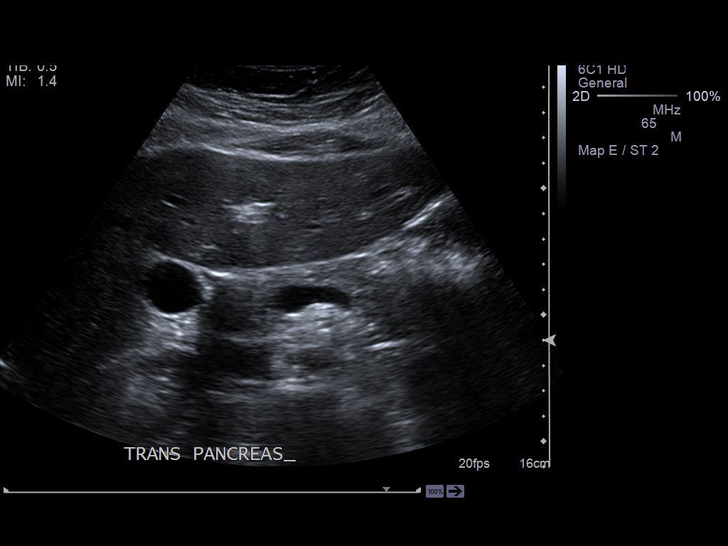
[im 3/33]
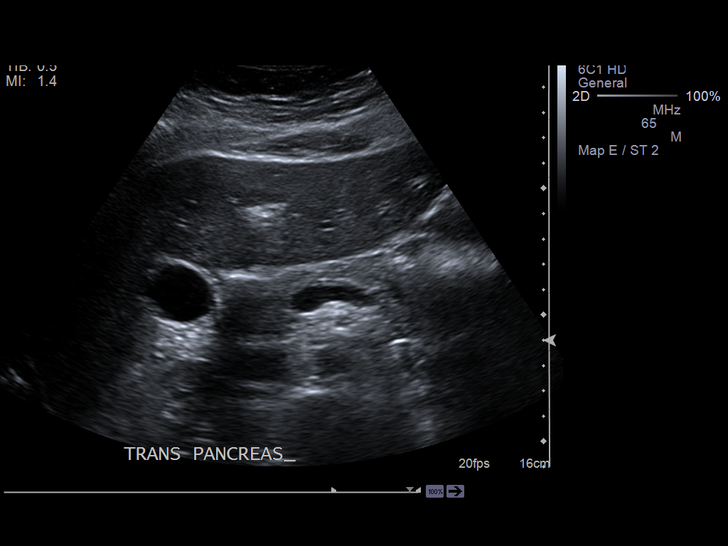
[im 6/33]
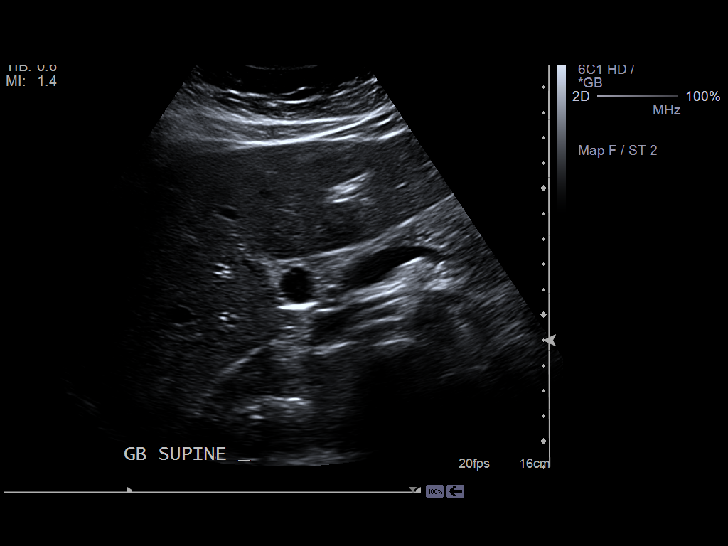
[im 9/33]
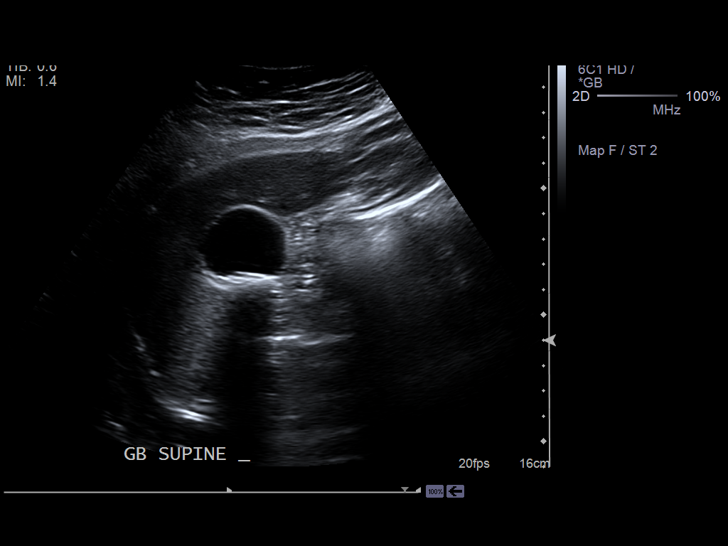
[im 11/33]
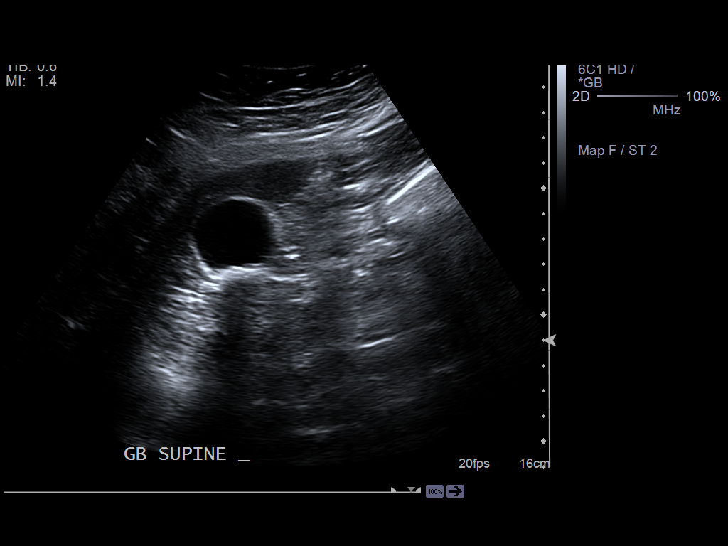
[im 13/33]
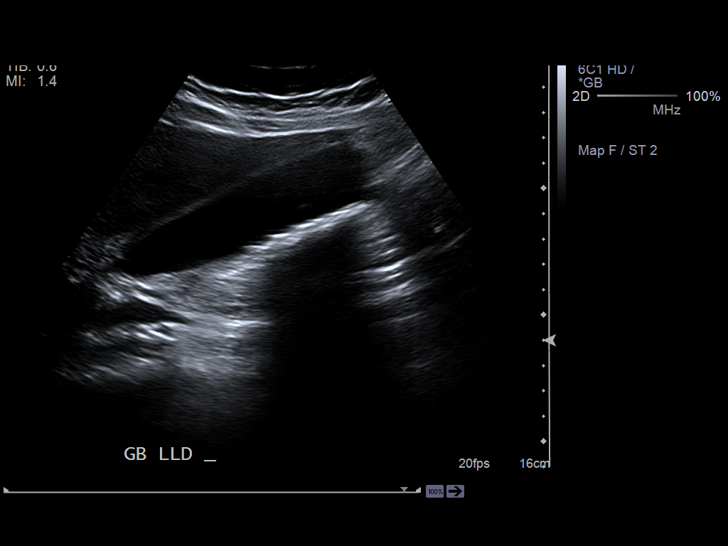
[im 15/33]
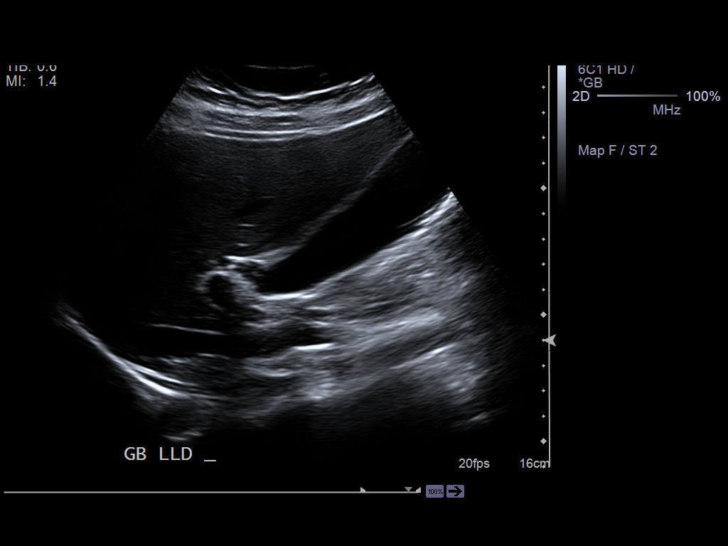
[im 18/33]
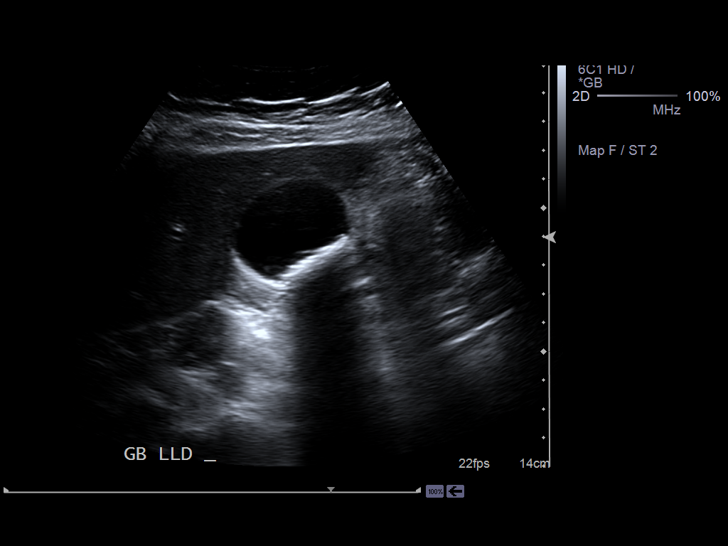
[im 21/33]
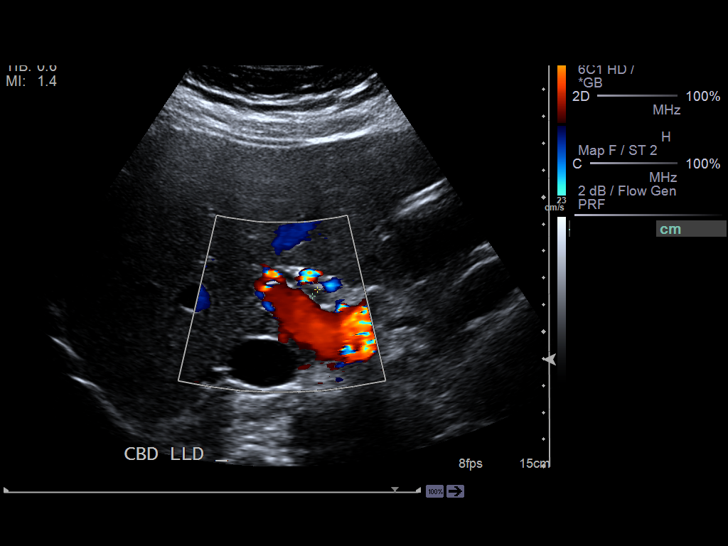
[im 22/33]
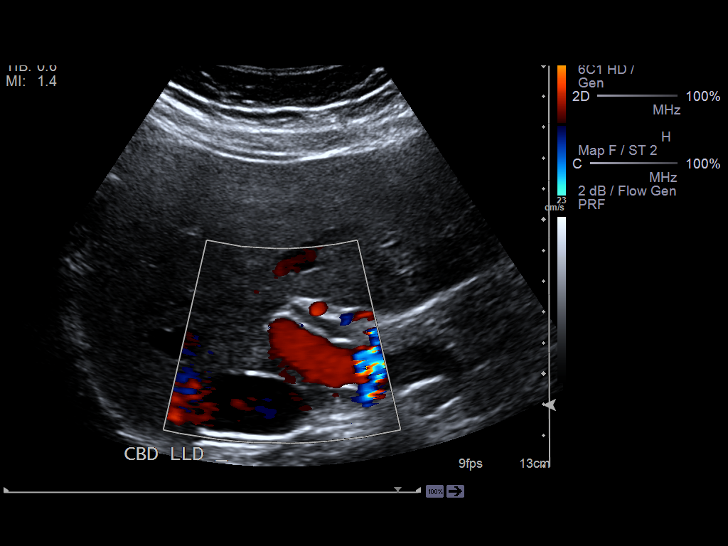
[im 25/33]
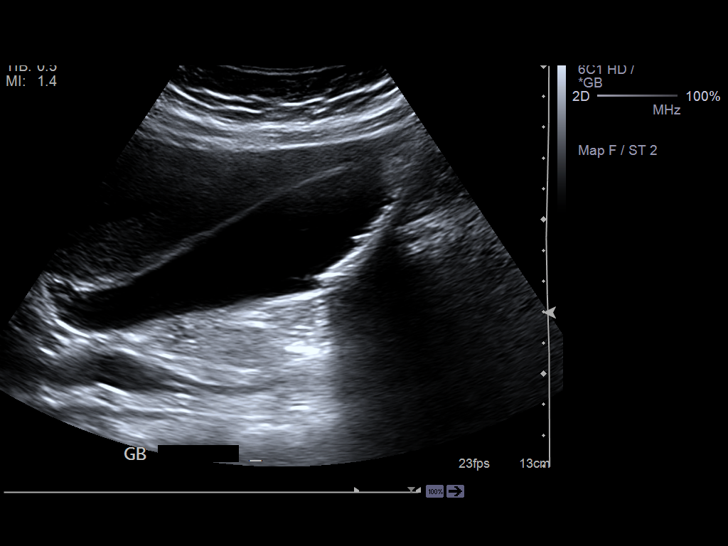
[im 27/33]
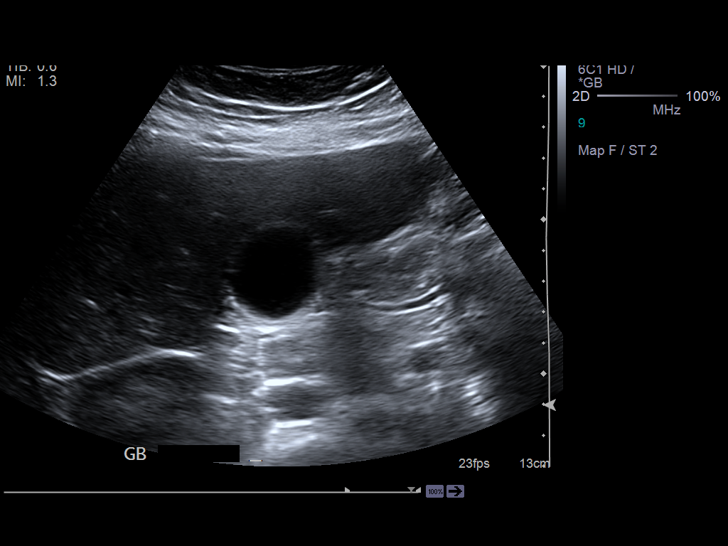
[im 30/33]
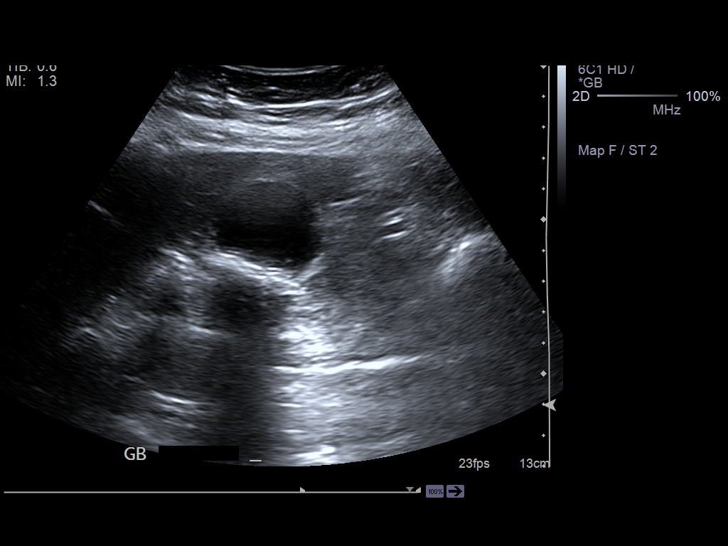
[im 33/33]
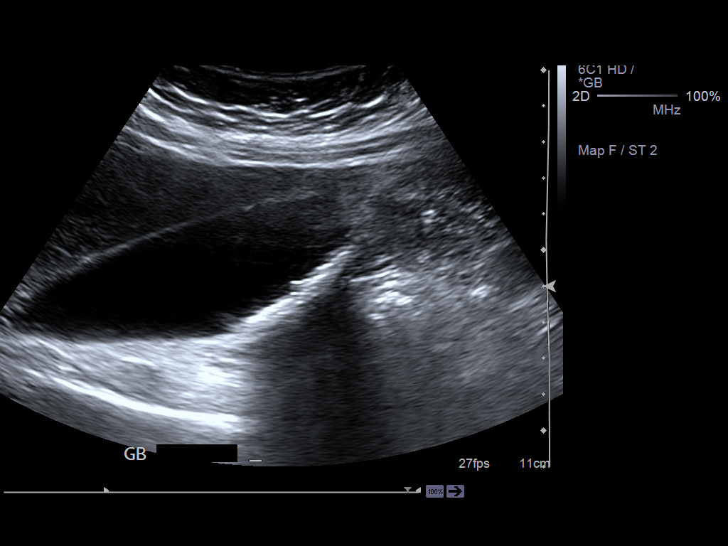

[14 of 25 positions shown; findings below may reference images not displayed]

IMPRESSION: Multiple gallstones. No evidence of gallbladder wall
thickening or pericholecystic fluid. Negative Murphy's sign. No biliary
distention.

## 2013-12-25 ENCOUNTER — Encounter (HOSPITAL_COMMUNITY): Payer: Self-pay | Admitting: *Deleted

## 2016-02-24 NOTE — L&D Delivery Note (Signed)
Delivery Summary for [name]  Labor Events:   Preterm labor:   Rupture date:   Rupture time:   Rupture type:   Fluid Color:   Induction:   Augmentation:   Complications:   Cervical ripening:          Delivery:   Episiotomy:   Lacerations:   Repair suture:   Repair # of packets:   Blood loss (ml): 500   Information for the patient's newborn:  Kevan NyFord, BoyA [name] [[id number]][[id number]]    Delivery 10/30/2016 8:40 AM by  C-Section, Low Transverse Sex:  female Gestational Age: 750w0d Delivery Clinician:   Living?:         APGARS  One minute Five minutes Ten minutes  Skin color:        Heart rate:        Grimace:        Muscle tone:        Breathing:        Totals: 8  9      Presentation/position:      Resuscitation:   Cord information:    Disposition of cord blood:     Blood gases sent?  Complications:   Placenta: Delivered:       appearance Newborn Measurements: Weight: 5 lb 11.9 oz (2605 g)  Height: 20"  Head circumference:    Chest circumference:    Other providers:    Additional  information: Forceps:   Vacuum:   Breech:   Observed anomalies      Information for the patient's newborn:  Tammy Leon, BoyB [name] [[id number]][[id number]]    Delivery 10/30/2016 8:42 AM by  C-Section, Low Transverse Sex:  female Gestational Age: 5850w0d Delivery Clinician:   Living?:         APGARS  One minute Five minutes Ten minutes  Skin color:        Heart rate:        Grimace:        Muscle tone:        Breathing:        Totals: 8  9      Presentation/position:      Resuscitation:   Cord information:    Disposition of cord blood:     Blood gases sent?  Complications:   Placenta: Delivered:       appearance Newborn Measurements: Weight: 5 lb 14.9 oz (2690 g)  Height: 18.5"  Head circumference:    Chest circumference:    Other providers:    Additional  information: Forceps:   Vacuum:   Breech:   Observed anomalies        See Dr. Oretha Milchherry's operative note for details of  C-section .    Hildred Laserherry, [place] Kretz, MD [facility]

## 2016-04-22 ENCOUNTER — Other Ambulatory Visit: Payer: Self-pay

## 2016-04-22 ENCOUNTER — Encounter: Payer: Self-pay | Admitting: Certified Nurse Midwife

## 2016-04-22 ENCOUNTER — Ambulatory Visit (INDEPENDENT_AMBULATORY_CARE_PROVIDER_SITE_OTHER): Payer: Medicaid Other | Admitting: Certified Nurse Midwife

## 2016-04-22 VITALS — BP 125/74 | HR 102 | Wt 175.5 lb

## 2016-04-22 DIAGNOSIS — Z3687 Encounter for antenatal screening for uncertain dates: Secondary | ICD-10-CM

## 2016-04-22 DIAGNOSIS — Z3480 Encounter for supervision of other normal pregnancy, unspecified trimester: Secondary | ICD-10-CM | POA: Diagnosis not present

## 2016-04-22 DIAGNOSIS — Z113 Encounter for screening for infections with a predominantly sexual mode of transmission: Secondary | ICD-10-CM

## 2016-04-22 DIAGNOSIS — E669 Obesity, unspecified: Secondary | ICD-10-CM

## 2016-04-22 DIAGNOSIS — Z1389 Encounter for screening for other disorder: Secondary | ICD-10-CM

## 2016-04-22 MED ORDER — FUSION PLUS PO CAPS: 1.0000 | ORAL_CAPSULE | Freq: Every day | ORAL | 3 refills | Status: DC

## 2016-04-22 NOTE — Addendum Note (Signed)
Addended by: Jackquline DenmarkIDGEWAY, Jeralynn Vaquera W on: 04/22/2016 03:38 PM   Modules accepted: Orders

## 2016-04-22 NOTE — Progress Notes (Signed)
NEW OB HISTORY AND PHYSICAL  SUBJECTIVE:       Tammy Leon is a 31 y.o. G3 42P1011 female, previous cesarean section for non reassuring fetal heart tones.  Patient's last menstrual period was 02/07/2016 (approximate)., [facility]. Estimated Date of Delivery: 11/13/16. Tammy Leon presents today for establishment of Prenatal Care. She complains of nausea without vomiting for several days.      Gynecologic History Patient's last menstrual period was 02/07/2016 (approximate). Normal Contraception: none Last Pap: "4 yrs ago". Results were: Normal per pt.  Obstetric History OB History  Gravida Para Term Preterm AB Living  3 1 1   1 1   SAB TAB Ectopic Multiple Live Births    1     1    # Outcome Date GA Lbr Len/2nd Weight Sex Delivery Anes PTL Lv  3 Current           2 TAB 2015        ND  1 Term 08/27/11 263w2d  6 lb 12.8 oz (3.084 kg) M CS-LTranv EPI  LIV      Past Medical History:  Diagnosis Date  . No pertinent past medical history     Past Surgical History:  Procedure Laterality Date  . CESAREAN SECTION  08/27/2011   Procedure: CESAREAN SECTION;  Surgeon: Oliver PilaKathy W Richardson, MD;  Location: [facility];  Service: Gynecology;  Laterality: N/A;    No current outpatient prescriptions on file prior to visit.   No current facility-administered medications on file prior to visit.     No Known Allergies  Social History   Social History  . Marital status: Single    Spouse name: N/A  . Number of children: N/A  . Years of education: N/A   Occupational History  . Not on file.   Social History Main Topics  . Smoking status: Current Some Day Smoker    Packs/day: 0.20    Types: Cigarettes  . Smokeless tobacco: Never Used     Comment: trying to quit, decreasing cigarrettes  . Alcohol use No     Comment: before pregnancy  . Drug use: No  . Sexual activity: Yes    Partners: Male    Birth control/ protection: None   Other  Topics Concern  . Not on file   Social History Narrative  . No narrative on file    Family History  Problem Relation Age of Onset  . Diabetes Father   . Asthma Father   . Diabetes Maternal Grandmother   . Cancer Paternal Grandmother     breast  . Diabetes Paternal Grandmother   . Stroke Paternal Grandmother   . Hypertension Paternal Grandfather   . Diabetes Paternal Grandfather   . Other Neg Hx      Review of Systems  Constitutional: Negative.   HENT: Negative.   Eyes: Negative.   Respiratory: Negative.   Cardiovascular: Negative.   Gastrointestinal: Positive for nausea.       Without vomiting, decreased appetite   Genitourinary: Negative.   Musculoskeletal: Negative.   Skin: Negative.   Neurological: Negative.   Endo/Heme/Allergies: Negative.   Psychiatric/Behavioral: Negative.    The following portions of the patient's history were reviewed and updated as appropriate: allergies, current medications, past OB history, past medical history, past surgical history, past family history, past social history, and problem list.    OBJECTIVE: Initial Physical Exam (New OB)  GENERAL APPEARANCE: alert, well appearing, in no apparent distress  HEAD: normocephalic, atraumatic MOUTH: mucous membranes moist, pharynx normal without lesions THYROID: no thyromegaly or masses present BREASTS: no masses noted, no significant tenderness, no palpable axillary nodes, no skin changes LUNGS: clear to auscultation, no wheezes, rales or rhonchi, symmetric air entry HEART: regular rate and rhythm, no murmurs ABDOMEN: soft, nontender, nondistended, no abnormal masses, no epigastric pain EXTREMITIES: no redness or tenderness in the calves or thighs, no edema SKIN: normal coloration and turgor, no rashes LYMPH NODES: no adenopathy palpable NEUROLOGIC: alert, oriented, normal speech, no focal findings or movement disorder noted  PELVIC EXAM EXTERNAL GENITALIA: normal appearing vulva with no  masses, tenderness or lesions VAGINA: discharge white, no odor, no lesions CERVIX: no lesions or cervical motion tenderness UTERUS: gravid ADNEXA: no masses palpable and nontender OB EXAM PELVIMETRY: appears adequate  ASSESSMENT: Normal pregnancy  PLAN: Prenatal care New OB counseling:The patient has been given an overview regarding routine prenatal care. Recommendations regarding diet, weight gain, medications in pregnancy, and cessation of smoking were given. Prenatal testing, optional genetic testing, and ultrasound use in pregnancy were reviewed.  Benefits of Breast Feeding were discussed. The patient is encouraged to consider nursing her baby post partum. Labs: routine prenatal labs, U/S for dating and viability as soon as possible. Return in 2-3 wks for fetal heart tone check.   Doreene Burke, CNM

## 2016-04-22 NOTE — Patient Instructions (Addendum)
Prenatal Care WHAT IS PRENATAL CARE? Prenatal care is the process of caring for a pregnant woman before she gives birth. Prenatal care makes sure that she and her baby remain as healthy as possible throughout pregnancy. Prenatal care may be provided by a midwife, family practice health care provider, or a childbirth and pregnancy specialist (obstetrician). Prenatal care may include physical examinations, testing, treatments, and education on nutrition, lifestyle, and social support services. WHY IS PRENATAL CARE SO IMPORTANT? Early and consistent prenatal care increases the chance that you and your baby will remain healthy throughout your pregnancy. This type of care also decreases a baby's risk of being born too early (prematurely), or being born smaller than expected (small for gestational age). Any underlying medical conditions you may have that could pose a risk during your pregnancy are discussed during prenatal care visits. You will also be monitored regularly for any new conditions that may arise during your pregnancy so they can be treated quickly and effectively. WHAT HAPPENS DURING PRENATAL CARE VISITS? Prenatal care visits may include the following: Discussion Tell your health care provider about any new signs or symptoms you have experienced since your last visit. These might include:  Nausea or vomiting.  Increased or decreased level of energy.  Difficulty sleeping.  Back or leg pain.  Weight changes.  Frequent urination.  Shortness of breath with physical activity.  Changes in your skin, such as the development of a rash or itchiness.  Vaginal discharge or bleeding.  Feelings of excitement or nervousness.  Changes in your baby's movements. You may want to write down any questions or topics you want to discuss with your health care provider and bring them with you to your appointment. Examination During your first prenatal care visit, you will likely have a complete  physical exam. Your health care provider will often examine your vagina, cervix, and the position of your uterus, as well as check your heart, lungs, and other body systems. As your pregnancy progresses, your health care provider will measure the size of your uterus and your baby's position inside your uterus. He or she may also examine you for early signs of labor. Your prenatal visits may also include checking your blood pressure and, after about 10-12 weeks of pregnancy, listening to your baby's heartbeat. Testing Regular testing often includes:  Urinalysis. This checks your urine for glucose, protein, or signs of infection.  Blood count. This checks the levels of white and red blood cells in your body.  Tests for sexually transmitted infections (STIs). Testing for STIs at the beginning of pregnancy is routinely done and is required in many states.  Antibody testing. You will be checked to see if you are immune to certain illnesses, such as rubella, that can affect a developing fetus.  Glucose screen. Around 24-28 weeks of pregnancy, your blood glucose level will be checked for signs of gestational diabetes. Follow-up tests may be recommended.  Group B strep. This is a bacteria that is commonly found inside a woman's vagina. This test will inform your health care provider if you need an antibiotic to reduce the amount of this bacteria in your body prior to labor and childbirth.  Ultrasound. Many pregnant women undergo an ultrasound screening around 18-20 weeks of pregnancy to evaluate the health of the fetus and check for any developmental abnormalities.  HIV (human immunodeficiency virus) testing. Early in your pregnancy, you will be screened for HIV. If you are at high risk for HIV, this test may be  repeated during your third trimester of pregnancy. You may be offered other testing based on your age, personal or family medical history, or other factors. HOW OFTEN SHOULD I PLAN TO SEE MY  HEALTH CARE PROVIDER FOR PRENATAL CARE? Your prenatal care check-up schedule depends on any medical conditions you have before, or develop during, your pregnancy. If you do not have any underlying medical conditions, you will likely be seen for checkups:  Monthly, during the first 6 months of pregnancy.  Twice a month during months 7 and 8 of pregnancy.  Weekly starting in the 9th month of pregnancy and until delivery. If you develop signs of early labor or other concerning signs or symptoms, you may need to see your health care provider more often. Ask your health care provider what prenatal care schedule is best for you. WHAT CAN I DO TO KEEP MYSELF AND MY BABY AS HEALTHY AS POSSIBLE DURING MY PREGNANCY?  Take a prenatal vitamin containing 400 micrograms (0.4 mg) of folic acid every day. Your health care provider may also ask you to take additional vitamins such as iodine, vitamin D, iron, copper, and zinc.  Take 1500-2000 mg of calcium daily starting at your 20th week of pregnancy until you deliver your baby.  Make sure you are up to date on your vaccinations. Unless directed otherwise by your health care provider:  You should receive a tetanus, diphtheria, and pertussis (Tdap) vaccination between the 27th and 36th week of your pregnancy, regardless of when your last Tdap immunization occurred. This helps protect your baby from whooping cough (pertussis) after he or she is born.  You should receive an annual inactivated influenza vaccine (IIV) to help protect you and your baby from influenza. This can be done at any point during your pregnancy.  Eat a well-rounded diet that includes:  Fresh fruits and vegetables.  Lean proteins.  Calcium-rich foods such as milk, yogurt, hard cheeses, and dark, leafy greens.  Whole grain breads.  Do noteat seafood high in mercury, including:  Swordfish.  Tilefish.  Shark.  King mackerel.  More than 6 oz tuna per week.  Do not eat:  Raw  or undercooked meats or eggs.  Unpasteurized foods, such as soft cheeses (brie, blue, or feta), juices, and milks.  Lunch meats.  Hot dogs that have not been heated until they are steaming.  Drink enough water to keep your urine clear or pale yellow. For many women, this may be 10 or more 8 oz glasses of water each day. Keeping yourself hydrated helps deliver nutrients to your baby and may prevent the start of pre-term uterine contractions.  Do not use any tobacco products including cigarettes, chewing tobacco, or electronic cigarettes. If you need help quitting, ask your health care provider.  Do not drink beverages containing alcohol. No safe level of alcohol consumption during pregnancy has been determined.  Do not use any illegal drugs. These can harm your developing baby or cause a miscarriage.  Ask your health care provider or pharmacist before taking any prescription or over-the-counter medicines, herbs, or supplements.  Limit your caffeine intake to no more than 200 mg per day.  Exercise. Unless told otherwise by your health care provider, try to get 30 minutes of moderate exercise most days of the week. Do not  do high-impact activities, contact sports, or activities with a high risk of falling, such as horseback riding or downhill skiing.  Get plenty of rest.  Avoid anything that raises your body temperature, such  as hot tubs and saunas.  If you own a cat, do not empty its litter box. Bacteria contained in cat feces can cause an infection called toxoplasmosis. This can result in serious harm to the fetus.  Stay away from chemicals such as insecticides, lead, mercury, and cleaning or paint products that contain solvents.  Do not have any X-rays taken unless medically necessary.  Take a childbirth and breastfeeding preparation class. Ask your health care provider if you need a referral or recommendation. This information is not intended to replace advice given to you by your  health care provider. Make sure you discuss any questions you have with your health care provider. Document Released: 02/12/2003 Document Revised: 07/15/2015 Document Reviewed: 04/26/2013 Elsevier Interactive Patient Education  2017 [employer]  Smoking During Pregnancy Smoking during pregnancy is unhealthy for you and your baby. Smoke from cigarettes, pipes, and cigars contains many chemicals that can cause cancer (carcinogens). Cigarettes also contain a stimulant drug (nicotine). When you smoke, harmful substances that you breathe in enter your bloodstream and can be passed on to your baby. This can affect your baby's development. If you are planning to become pregnant or have recently become pregnant, talk with your health care provider about quitting smoking. How does smoking affect me? Smoking increases your risk for many long-term (chronic) diseases. These diseases include cancer, lung diseases, and heart disease. Smoking during pregnancy increases your risk of:  Losing the pregnancy (miscarriage or stillbirth).  Giving birth too early (premature birth).  Pregnancy outside of the uterus (tubal pregnancy).  Having problems with the organ that provides the baby nourishment and oxygen (placenta), including:  Attachment of the placenta over the opening of the uterus (placenta previa).  Detachment of the placenta before the baby's birth (placental abruption).  Having your water break before labor begins (premature rupture of membranes). How does smoking affect my baby? Before Birth  Smoking during pregnancy:  Decreases blood flow and oxygen to your baby.  Increases your baby's risk of birth defects, such as heart defects.  Increases your baby's heart rate.  Slows your baby's growth in the uterus (intrauterine growth retardation). After Birth  Babies born to women who smoked during pregnancy may:  Have symptoms of nicotine withdrawal.  Need to stay in the hospital for  special care.  May be too small at birth.  Have a high risk of:  Serious health problems or lifelong disabilities.  Sudden infant death syndrome (SIDS).  Becoming obese.  Developing behavior or learning problems. What can happen if changes are not made? When babies are born with a birth defect or illness, they often need to stay in the hospital longer before going home. Hospital stays may also be longer if you had any complications during labor or delivery. Longer hospital stays and more treatments result in higher costs for health care. Many health issues among babies born to mothers who smoke can have a lifelong impact. This may include the long-term need for certain medicines, therapies, or other treatments. What are the benefits of not smoking during pregnancy? You have a much better chance of having a healthy pregnancy and a healthy baby if you do not smoke while you are pregnant. Not smoking also means that you will have a better chance of living a long and healthy life, and your baby will have a better chance of growing into a healthy child and adult. What actions can be taken? Quitting smoking can be difficult. Ask your health care provider for  help to stop smoking. You may also consider:  Counseling to help you quit smoking (smoking cessation counseling).  Psychotherapy.  Acupuncture.  Hypnosis.  Telephone [employer]. If these methods do not help you, talk with your health care provider about other options. Do not take smoking cessation medicines or nicotine supplements unless your health care provider tells you to. Where to find more information: Learn more about smoking during pregnancy and quitting smoking from:  March of Dimes: www.marchofdimes.org/pregnancy/smoking-during-pregnancy.aspx  U.S. Department of Health and Human Services: women.smokefree.gov  American Cancer Society: www.cancer.org  American Heart Association: www.heart.org  National Cancer  Institute: www.cancer.gov For help to quit smoking:  National smoking cessation telephone hotline: 1-800-QUIT NOW (813)272-8755) Contact a health care provider if:  You are struggling to quit smoking.  You are a smoker and you become pregnant or plan to become pregnant.  You start smoking again after giving birth. Summary  Tobacco smoke contains harmful substances that can affect a baby's health and development.  Smoking increases the risk for serious problems, such as miscarriage, birth defects, or premature birth.  If you need help to quit smoking, ask your health care provider. This information is not intended to replace advice given to you by your health care provider. Make sure you discuss any questions you have with your health care provider. Document Released: 06/23/2004 Document Revised: 11/29/2015 Document Reviewed: 11/29/2015 Elsevier Interactive Patient Education  2017 [employer] Inc.  Pregnancy and Zika Virus Disease Zika virus disease, or Zika, is an illness that can spread to people from mosquitoes that carry the virus. It may also spread from person to person through infected body fluids. Zika first occurred in [place], but recently it has spread to new areas. The virus occurs in tropical climates. The location of Zika continues to change. Most people who become infected with Zika virus do not develop serious illness. However, Zika may cause birth defects in an unborn baby whose mother is infected with the virus. It may also increase the risk of miscarriage. What are the symptoms of Zika virus disease? In many cases, people who have been infected with Zika virus do not develop any symptoms. If symptoms appear, they usually start about a week after the person is infected. Symptoms are usually mild. They may include:  Fever.  Rash.  Red eyes.  Joint pain. How does Zika virus disease spread? The main way that Zika virus spreads is through the bite of a certain type of  mosquito. Unlike most types of mosquitos, which bite only at night, the type of mosquito that carries Zika virus bites both at night and during the day. Zika virus can also spread through sexual contact, through a blood transfusion, and from a mother to her baby before or during birth. Once you have had Zika virus disease, it is unlikely that you will get it again. Can I pass Zika to my baby during pregnancy? Yes, Zika can pass from a mother to her baby before or during birth. What problems can Zika cause for my baby? A woman who is infected with Zika virus while pregnant is at risk of having her baby born with a condition in which the brain or head is smaller than expected (microcephaly). Babies who have microcephaly can have developmental delays, seizures, hearing problems, and vision problems. Having Zika virus disease during pregnancy can also increase the risk of miscarriage. How can Zika virus disease be prevented? There is no vaccine to prevent Zika. The best way to prevent the  disease is to avoid infected mosquitoes and avoid exposure to body fluids that can spread the virus. Avoid any possible exposure to Zika by taking the following precautions. For women and their sex partners:  Avoid traveling to high-risk areas. The locations where [place] is being reported change often. To identify high-risk areas, check the CDC travel website: http://davidson-gomez.com/  If you or your sex partner must travel to a high-risk area, talk with a health care provider before and after traveling.  Take all precautions to avoid mosquito bites if you live in, or travel to, any of the high-risk areas. Insect repellents are safe to use during pregnancy.  Ask your health care provider when it is safe to have sexual contact. For women:  If you are pregnant or trying to become pregnant, avoid sexual contact with persons who may have been exposed to [place] virus, persons who have possible symptoms of Zika, or  persons whose history you are unsure about. If you choose to have sexual contact with someone who may have been exposed to [place] virus, use condoms correctly during the entire duration of sexual activity, every time. Do not share sexual devices, as you may be exposed to body fluids.  Ask your health care provider about when it is safe to attempt pregnancy after a possible exposure to Zika virus. What steps should I take to avoid mosquito bites? Take these steps to avoid mosquito bites when you are in a high-risk area:  Wear loose clothing that covers your arms and legs.  Limit your outdoor activities.  Do not open windows unless they have window screens.  Sleep under mosquito nets.  Use insect repellent. The best insect repellents have:  DEET, picaridin, oil of lemon eucalyptus (OLE), or [id number] in them.  Higher amounts of an active ingredient in them.  Remember that insect repellents are safe to use during pregnancy.  Do not use OLE on children who are younger than 52 years of age. Do not use insect repellent on babies who are younger than 32 months of age.  Cover your child's stroller with mosquito netting. Make sure the netting fits snugly and that any loose netting does not cover your child's mouth or nose. Do not use a blanket as a mosquito-protection cover.  Do not apply insect repellent underneath clothing.  If you are using sunscreen, apply the sunscreen before applying the insect repellent.  Treat clothing with permethrin. Do not apply permethrin directly to your skin. Follow label directions for safe use.  Get rid of standing water, where mosquitoes may reproduce. Standing water is often found in items such as buckets, bowls, animal food dishes, and flowerpots. When you return from traveling to any high-risk area, continue taking actions to protect yourself against mosquito bites for 3 weeks, even if you show no signs of illness. This will prevent spreading Zika virus to  uninfected mosquitoes. What should I know about the sexual transmission of Zika? People can spread Zika to their sexual partners during vaginal, anal, or oral sex, or by sharing sexual devices. Many people with [place] do not develop symptoms, so a person could spread the disease without knowing that they are infected. The greatest risk is to women who are pregnant or who may become pregnant. Zika virus can live longer in semen than it can live in blood. Couples can prevent sexual transmission of the virus by:  Using condoms correctly during the entire duration of sexual activity, every time. This includes vaginal, anal, and oral sex.  Not sharing sexual devices. Sharing increases your risk of being exposed to body fluid from another person.  Avoiding all sexual activity until your health care provider says it is safe. Should I be tested for Zika virus? A sample of your blood can be tested for Zika virus. A pregnant woman should be tested if she may have been exposed to the virus or if she has symptoms of Zika. She may also have additional tests done during her pregnancy, such ultrasound testing. Talk with your health care provider about which tests are recommended. This information is not intended to replace advice given to you by your health care provider. Make sure you discuss any questions you have with your health care provider. Document Released: 10/31/2014 Document Revised: 07/18/2015 Document Reviewed: 10/24/2014 Elsevier Interactive Patient Education  2017 Elsevier Inc.  Minor Illnesses and Medications in Pregnancy  Cold/Flu:  Sudafed for congestion- Robitussin (plain) for cough- Tylenol for discomfort.  Please follow the directions on the label.  Try not to take any more than needed.  OTC Saline nasal spray and air humidifier or cool-mist  Vaporizer to sooth nasal irritation and to loosen congestion.  It is also important to increase intake of non carbonated fluids, especially if you have a  fever.  Constipation:  Colace-2 capsules at bedtime; Metamucil- follow directions on label; Senokot- 1 tablet at bedtime.  Any one of these medications can be used.  It is also very important to increase fluids and fruits along with regular exercise.  If problem persists please call the office.  Diarrhea:  Kaopectate as directed on the label.  Eat a bland diet and increase fluids.  Avoid highly seasoned foods.  Headache:  Tylenol 1 or 2 tablets every 3-4 hours as needed  Indigestion:  Maalox, Mylanta, Tums or Rolaids- as directed on label.  Also try to eat small meals and avoid fatty, greasy or spicy foods.  Nausea with or without Vomiting:  Nausea in pregnancy is caused by increased levels of hormones in the body which influence the digestive system and cause irritation when stomach acids accumulate.  Symptoms usually subside after 1st trimester of pregnancy.  Try the following: Keep saltines, graham crackers or dry toast by your bed to eat upon awakening. Don't let your stomach get empty.  Try to eat 5-6 small meals per day instead of 3 large ones. Avoid greasy fatty or highly seasoned foods.  Take OTC Unisom 1 tablet at bed time along with OTC Vitamin B6 25-50 mg 3 times per day.    If nausea continues with vomiting and you are unable to keep down food and fluids you may need a prescription medication.  Please notify your provider.   Sore throat:  Chloraseptic spray, throat lozenges and or plain Tylenol.  Vaginal Yeast Infection:  OTC Monistat for 7 days as directed on label.  If symptoms do not resolve within a week notify provider.  If any of the above problems do not subside with recommended treatment please call the office for further assistance.   Do not take Aspirin, Advil, Motrin or Ibuprofen.  * * OTC= Over the counter  Hyperemesis Gravidarum Hyperemesis gravidarum is a severe form of nausea and vomiting that happens during pregnancy. Hyperemesis is worse than morning sickness. It  may cause you to have nausea or vomiting all day for many days. It may keep you from eating and drinking enough food and liquids. Hyperemesis usually occurs during the first half (the first 20 weeks) of pregnancy.  It often goes away once a woman is in her second half of pregnancy. However, sometimes hyperemesis continues through an entire pregnancy. What are the causes? The cause of this condition is not known. It may be related to changes in chemicals (hormones) in the body during pregnancy, such as the high level of pregnancy hormone (human chorionic gonadotropin) or the increase in the female sex hormone (estrogen). What are the signs or symptoms? Symptoms of this condition include:  Severe nausea and vomiting.  Nausea that does not go away.  Vomiting that does not allow you to keep any food down.  Weight loss.  Body fluid loss (dehydration).  Having no desire to eat, or not liking food that you have previously enjoyed. How is this diagnosed? This condition may be diagnosed based on:  A physical exam.  Your medical history.  Your symptoms.  Blood tests.  Urine tests. How is this treated? This condition may be managed with medicine. If medicines to do not help relieve nausea and vomiting, you may need to receive fluids through an IV tube at the hospital. Follow these instructions at home:  Take over-the-counter and prescription medicines only as told by your health care provider.  Avoid iron pills and multivitamins that contain iron for the first 3-4 months of pregnancy. If you take prescription iron pills, do not stop taking them unless your health care provider approves.  Take the following actions to help prevent nausea and vomiting:  In the morning, before getting out of bed, try eating a couple of dry crackers or a piece of toast.  Avoid foods and smells that upset your stomach. Fatty and spicy foods may make nausea worse.  Eat 5-6 small meals a day.  Do not drink  fluids while eating meals. Drink between meals.  Eat or suck on things that have ginger in them. Ginger can help relieve nausea.  Avoid food preparation. The smell of food can spoil your appetite or trigger nausea.  Follow instructions from your health care provider about eating or drinking restrictions.  For snacks, eat high-protein foods, such as cheese.  Keep all follow-up and pre-birth (prenatal) visits as told by your health care provider. This is important. Contact a health care provider if:  You have pain in your abdomen.  You have a severe headache.  You have vision problems.  You are losing weight. Get help right away if:  You cannot drink fluids without vomiting.  You vomit blood.  You have constant nausea and vomiting.  You are very weak.  You are very thirsty.  You feel dizzy.  You faint.  You have a fever or other symptoms that last for more than 2-3 days.  You have a fever and your symptoms suddenly get worse. Summary  Hyperemesis gravidarum is a severe form of nausea and vomiting that happens during pregnancy.  Making some changes to your eating habits may help relieve nausea and vomiting.  This condition may be managed with medicine.  If medicines to do not help relieve nausea and vomiting, you may need to receive fluids through an IV tube at the hospital. This information is not intended to replace advice given to you by your health care provider. Make sure you discuss any questions you have with your health care provider. Document Released: 02/09/2005 Document Revised: 10/09/2015 Document Reviewed: 10/09/2015 Elsevier Interactive Patient Education  2017 [employer].  Commonly Asked Questions During Pregnancy  Cats: A parasite can be excreted in cat feces.  To  avoid exposure you need to have another person empty the little box.  If you must empty the litter box you will need to wear gloves.  Wash your hands after handling your cat.  This  parasite can also be found in raw or undercooked meat so this should also be avoided.  Colds, Sore Throats, Flu: Please check your medication sheet to see what you can take for symptoms.  If your symptoms are unrelieved by these medications please call the office.  Dental Work: Most any dental work Agricultural consultant recommends is permitted.  X-rays should only be taken during the first trimester if absolutely necessary.  Your abdomen should be shielded with a lead apron during all x-rays.  Please notify your provider prior to receiving any x-rays.  Novocaine is fine; gas is not recommended.  If your dentist requires a note from [place] prior to dental work please call the office and we will provide one for you.  Exercise: Exercise is an important part of staying healthy during your pregnancy.  You may continue most exercises you were accustomed to prior to pregnancy.  Later in your pregnancy you will most likely notice you have difficulty with activities requiring balance like riding a bicycle.  It is important that you listen to your body and avoid activities that put you at a higher risk of falling.  Adequate rest and staying well hydrated are a must!  If you have questions about the safety of specific activities ask your provider.    Exposure to Children with illness: Try to avoid obvious exposure; report any symptoms to [place] when noted,  If you have chicken pos, red measles or mumps, you should be immune to these diseases.   Please do not take any vaccines while pregnant unless you have checked with your OB provider.  Fetal Movement: After 28 weeks we recommend you do "kick counts" twice daily.  Lie or sit down in a calm quiet environment and count your baby movements "kicks".  You should feel your baby at least 10 times per hour.  If you have not felt 10 kicks within the first hour get up, walk around and have something sweet to eat or drink then repeat for an additional hour.  If count remains less than 10 per  hour notify your provider.  Fumigating: Follow your pest control agent's advice as to how long to stay out of your home.  Ventilate the area well before re-entering.  Hemorrhoids:   Most over-the-counter preparations can be used during pregnancy.  Check your medication to see what is safe to use.  It is important to use a stool softener or fiber in your diet and to drink lots of liquids.  If hemorrhoids seem to be getting worse please call the office.   Hot Tubs:  Hot tubs Jacuzzis and saunas are not recommended while pregnant.  These increase your internal body temperature and should be avoided.  Intercourse:  Sexual intercourse is safe during pregnancy as long as you are comfortable, unless otherwise advised by your provider.  Spotting may occur after intercourse; report any bright red bleeding that is heavier than spotting.  Labor:  If you know that you are in labor, please go to the hospital.  If you are unsure, please call the office and let us help you decide what to do.  Lifting, straining, etc:  If your job requires heavy lifting or straining please check with your provider for any limitations.  Generally, you should not  lift items heavier than that you can lift simply with your hands and arms (no back muscles)  Painting:  Paint fumes do not harm your pregnancy, but may make you ill and should be avoided if possible.  Latex or water based paints have less odor than oils.  Use adequate ventilation while painting.  Permanents & Hair Color:  Chemicals in hair dyes are not recommended as they cause increase hair dryness which can increase hair loss during pregnancy.  " Highlighting" and permanents are allowed.  Dye may be absorbed differently and permanents may not hold as well during pregnancy.  Sunbathing:  Use a sunscreen, as skin burns easily during pregnancy.  Drink plenty of fluids; avoid over heating.  Tanning Beds:  Because their possible side effects are still unknown, tanning beds  are not recommended.  Ultrasound Scans:  Routine ultrasounds are performed at approximately 20 weeks.  You will be able to see your baby's general anatomy an if you would like to know the gender this can usually be determined as well.  If it is questionable when you conceived you may also receive an ultrasound early in your pregnancy for dating purposes.  Otherwise ultrasound exams are not routinely performed unless there is a medical necessity.  Although you can request a scan we ask that you pay for it when conducted because insurance does not cover " patient request" scans.  Work: If your pregnancy proceeds without complications you may work until your due date, unless your physician or employer advises otherwise.  Round Ligament Pain/Pelvic Discomfort:  Sharp, shooting pains not associated with bleeding are fairly common, usually occurring in the second trimester of pregnancy.  They tend to be worse when standing up or when you remain standing for long periods of time.  These are the result of pressure of certain pelvic ligaments called "round ligaments".  Rest, Tylenol and heat seem to be the most effective relief.  As the womb and fetus grow, they rise out of the pelvis and the discomfort improves.  Please notify the office if your pain seems different than that described.  It may represent a more serious condition.

## 2016-04-22 NOTE — Progress Notes (Signed)
Pt presents for NOB PE. Confirmation from [facility], [place]. UPT: POSITIVE. LMP: 02/07/2016- unsure. EGA: 10.5 wk.  EDD: 11/13/2016.  G3, P1011. BMI: 32. Last pap was 4.5 years ago. No history of abnormal paps.

## 2016-04-23 ENCOUNTER — Encounter: Payer: Self-pay | Admitting: Certified Nurse Midwife

## 2016-04-23 LAB — HIV ANTIBODY (ROUTINE TESTING W REFLEX): HIV Screen 4th Generation wRfx: NONREACTIVE

## 2016-04-23 LAB — CBC WITH DIFFERENTIAL/PLATELET
BASOS: 0 %
Basophils Absolute: 0 10*3/uL (ref 0.0–0.2)
EOS (ABSOLUTE): 0.1 10*3/uL (ref 0.0–0.4)
EOS: 1 %
HEMATOCRIT: 36.4 % (ref 34.0–46.6)
HEMOGLOBIN: 12.8 g/dL (ref 11.1–15.9)
IMMATURE GRANS (ABS): 0 10*3/uL (ref 0.0–0.1)
Immature Granulocytes: 0 %
LYMPHS: 19 %
Lymphocytes Absolute: 2.2 10*3/uL (ref 0.7–3.1)
MCH: 30.9 pg (ref 26.6–33.0)
MCHC: 35.2 g/dL (ref 31.5–35.7)
MCV: 88 fL (ref 79–97)
Monocytes Absolute: 0.4 10*3/uL (ref 0.1–0.9)
Monocytes: 3 %
NEUTROS ABS: 9 10*3/uL — AB (ref 1.4–7.0)
Neutrophils: 77 %
Platelets: 312 10*3/uL (ref 150–379)
RBC: 4.14 x10E6/uL (ref 3.77–5.28)
RDW: 12.4 % (ref 12.3–15.4)
WBC: 11.8 10*3/uL — ABNORMAL HIGH (ref 3.4–10.8)

## 2016-04-23 LAB — RH TYPE: Rh Factor: POSITIVE

## 2016-04-23 LAB — TSH: TSH: 2.83 u[IU]/mL (ref 0.450–4.500)

## 2016-04-23 LAB — HEPATITIS B SURFACE ANTIGEN: Hepatitis B Surface Ag: NEGATIVE

## 2016-04-23 LAB — HEMOGLOBIN A1C
Est. average glucose Bld gHb Est-mCnc: 97 mg/dL
HEMOGLOBIN A1C: 5 % (ref 4.8–5.6)

## 2016-04-23 LAB — VARICELLA ZOSTER ANTIBODY, IGG: Varicella zoster IgG: 572 index (ref >165)

## 2016-04-23 LAB — ABO

## 2016-04-23 LAB — ANTIBODY SCREEN: Antibody Screen: NEGATIVE

## 2016-04-23 LAB — RPR: RPR: NONREACTIVE

## 2016-04-23 LAB — RUBELLA SCREEN: RUBELLA: 2.42 {index} (ref >0.99)

## 2016-04-24 ENCOUNTER — Ambulatory Visit (INDEPENDENT_AMBULATORY_CARE_PROVIDER_SITE_OTHER): Payer: Medicaid Other

## 2016-04-24 ENCOUNTER — Other Ambulatory Visit: Payer: Self-pay | Admitting: Certified Nurse Midwife

## 2016-04-24 ENCOUNTER — Other Ambulatory Visit: Payer: Medicaid Other

## 2016-04-24 DIAGNOSIS — O30041 Twin pregnancy, dichorionic/diamniotic, first trimester: Secondary | ICD-10-CM | POA: Diagnosis not present

## 2016-04-24 DIAGNOSIS — Z3401 Encounter for supervision of normal first pregnancy, first trimester: Secondary | ICD-10-CM

## 2016-04-24 DIAGNOSIS — Z3480 Encounter for supervision of other normal pregnancy, unspecified trimester: Secondary | ICD-10-CM | POA: Diagnosis not present

## 2016-04-24 DIAGNOSIS — Z3687 Encounter for antenatal screening for uncertain dates: Secondary | ICD-10-CM

## 2016-04-24 LAB — URINE CULTURE, OB REFLEX

## 2016-04-24 LAB — CULTURE, OB URINE

## 2016-04-25 LAB — GC/CHLAMYDIA PROBE AMP
Chlamydia trachomatis, NAA: NEGATIVE
Neisseria gonorrhoeae by PCR: NEGATIVE

## 2016-04-26 LAB — URINALYSIS, ROUTINE W REFLEX MICROSCOPIC
BILIRUBIN UA: NEGATIVE
GLUCOSE, UA: NEGATIVE
LEUKOCYTES UA: NEGATIVE
Nitrite, UA: NEGATIVE
PROTEIN UA: NEGATIVE
RBC UA: NEGATIVE
Urobilinogen, Ur: 0.2 mg/dL (ref 0.2–1.0)
pH, UA: 5.5 (ref 5.0–7.5)

## 2016-04-26 LAB — MONITOR DRUG PROFILE 14(MW)
Amphetamine Scrn, Ur: NEGATIVE ng/mL
BARBITURATE SCREEN URINE: NEGATIVE ng/mL
BENZODIAZEPINE SCREEN, URINE: NEGATIVE ng/mL
Buprenorphine, Urine: NEGATIVE ng/mL
Cocaine (Metab) Scrn, Ur: NEGATIVE ng/mL
Creatinine(Crt), U: 215.4 mg/dL (ref 20.0–300.0)
Fentanyl, Urine: NEGATIVE pg/mL
Meperidine Screen, Urine: NEGATIVE ng/mL
Methadone Screen, Urine: NEGATIVE ng/mL
OXYCODONE+OXYMORPHONE UR QL SCN: NEGATIVE ng/mL
Opiate Scrn, Ur: NEGATIVE ng/mL
Ph of Urine: 5.8 (ref 4.5–8.9)
Phencyclidine Qn, Ur: NEGATIVE ng/mL
Propoxyphene Scrn, Ur: NEGATIVE ng/mL
SPECIFIC GRAVITY: 1.025
Tramadol Screen, Urine: NEGATIVE ng/mL

## 2016-04-26 LAB — NICOTINE SCREEN, URINE: Cotinine Ql Scrn, Ur: POSITIVE ng/mL

## 2016-04-26 LAB — CANNABINOID (GC/MS), URINE
Cannabinoid: POSITIVE — AB
Carboxy THC (GC/MS): 134 ng/mL

## 2016-04-27 ENCOUNTER — Encounter: Payer: Self-pay | Admitting: Certified Nurse Midwife

## 2016-04-27 LAB — CYTOLOGY - PAP

## 2016-05-04 ENCOUNTER — Encounter: Payer: Self-pay | Admitting: Certified Nurse Midwife

## 2016-05-06 LAB — MATERNIT 21 PLUS CORE, BLOOD
CHROMOSOME 21: NEGATIVE
Chromosome 13: NEGATIVE
Chromosome 18: NEGATIVE
Y CHROMOSOME: DETECTED

## 2016-05-20 ENCOUNTER — Ambulatory Visit (INDEPENDENT_AMBULATORY_CARE_PROVIDER_SITE_OTHER): Payer: Medicaid Other | Admitting: Certified Nurse Midwife

## 2016-05-20 ENCOUNTER — Encounter: Payer: Self-pay | Admitting: Certified Nurse Midwife

## 2016-05-20 VITALS — BP 99/59 | HR 89 | Wt 168.7 lb

## 2016-05-20 DIAGNOSIS — R319 Hematuria, unspecified: Secondary | ICD-10-CM

## 2016-05-20 DIAGNOSIS — N390 Urinary tract infection, site not specified: Secondary | ICD-10-CM

## 2016-05-20 DIAGNOSIS — Z3482 Encounter for supervision of other normal pregnancy, second trimester: Secondary | ICD-10-CM

## 2016-05-20 LAB — POCT URINALYSIS DIPSTICK
BILIRUBIN UA: NEGATIVE
GLUCOSE UA: NEGATIVE
Leukocytes, UA: NEGATIVE
Nitrite, UA: NEGATIVE
SPEC GRAV UA: 1.025 (ref 1.030–1.035)
UROBILINOGEN UA: NEGATIVE (ref ?–2.0)
pH, UA: 6 (ref 5.0–8.0)

## 2016-05-20 NOTE — Progress Notes (Signed)
ROB- pt doing well. Complains of some mild cramping x 1 and a separate incidence of spotting she noticed with whipping. Reviewed urine results. Blood noted in urine, she denies UTI symptoms. She states that she was intimate with her partner last night. Urine sent for culture. She thinks she may be feeling movement stating that she occasionally feels "fluttering". FHT baby A Right side 145, baby B left side 154. Discussed mode of delivery (Vaginal vs. C/S) in regard to twins. Encouraged pt to do some research and discuss with her partner. She will return in 4 wks for ROB and in 6 wks for anatomy scan.   Tammy BurkeAnnie Roneka Leon, CNM

## 2016-05-20 NOTE — Patient Instructions (Signed)
Multiple Pregnancy Having a multiple pregnancy means that a woman is carrying more than one baby at a time. She may be pregnant with twins, triplets, or more. The majority of multiple pregnancies are twins. Naturally conceiving triplets or more (higher-order multiples) is rare. Multiple pregnancies are riskier than single pregnancies. A woman with a multiple pregnancy is more likely to have certain problems during her pregnancy. Therefore, she will need to have more frequent appointments for prenatal care. How does a multiple pregnancy happen? A multiple pregnancy happens when:  The woman's body releases more than one egg at a time, and then each egg gets fertilized by a different sperm.  This is the most common type of multiple pregnancy.  Twins or other multiples produced this way are fraternal. They are no more alike than non-multiple siblings are.  One sperm fertilizes one egg, which then divides into more than one embryo.  Twins or other multiples produced this way are identical. Identical multiples are always the same gender, and they look very much alike. Who is most likely to have a multiple pregnancy? A multiple pregnancy is more likely to develop in women who:  Have had fertility treatment, especially if the treatment included fertility drugs.  Are older than 30 years of age.  Have already had four or more children.  Have a family history of multiple pregnancy. How is a multiple pregnancy diagnosed? A multiple pregnancy may be diagnosed based on:  Symptoms such as:  Rapid weight gain in the first 3 months of pregnancy (first trimester).  More severe nausea and breast tenderness than what is typical of a single pregnancy.  The uterus measuring larger than what is normal for the stage of the pregnancy.  Blood tests that detect a higher-than-normal level of human chorionic gonadotropin (hCG). This is a hormone that your body produces in early pregnancy.  Ultrasound exam.  This is used to confirm that you are carrying multiples. What risks are associated with multiple pregnancy? A multiple pregnancy puts you at a higher risk for certain problems during or after your pregnancy, including:  Having your babies delivered before you have reached a full-term pregnancy (preterm birth). A full-term pregnancy lasts for at least 37 weeks. Babies born before 37 weeks may have a higher risk of a variety of health problems, such as breathing problems, feeding difficulties, cerebral palsy, and learning disabilities.  Diabetes.  Preeclampsia. This is a serious condition that causes high blood pressure along with other symptoms, such as swelling and headaches, during pregnancy.  Excessive blood loss after childbirth (postpartum hemorrhage).  Postpartum depression.  Low birth weight of the babies. How will having a multiple pregnancy affect my care? Your health care provider will want to monitor you more closely during your pregnancy to make sure that your babies are growing normally and that you are healthy. Follow these instructions at home: Because your pregnancy is considered to be high risk, you will need to work closely with your health care team. You may also need to make some lifestyle changes. These may include the following: Eating and drinking   Increase your nutrition.  Follow your health care provider's recommendations for weight gain. You may need to gain a little extra weight when you are pregnant with multiples.  Eat healthy snacks often throughout the day. This can add calories and reduce nausea.  Drink enough fluid to keep your urine clear or pale yellow.  Take prenatal vitamins. Activity  By 20-24 weeks, you may need to   limit your activities.  Avoid activities and work that take a lot of effort (are strenuous).  Ask your health care provider when you should stop having sexual intercourse.  Rest often. General instructions   Do not use any  products that contain nicotine or tobacco, such as cigarettes and e-cigarettes. If you need help quitting, ask your health care provider.  Do not drink alcohol or use illegal drugs.  Take over-the-counter and prescription medicines only as told by your health care provider.  Arrange for extra help around the house.  Keep all follow-up visits and all prenatal visits as told by your health care provider. This is important. Contact a health care provider if:  You have dizziness.  You have persistent nausea, vomiting, or diarrhea.  You are having trouble gaining weight.  You have feelings of depression or other emotions that are interfering with your normal activities. Get help right away if:  You have a fever.  You have pain with urination.  You have fluid leaking from your vagina.  You have a bad-smelling vaginal discharge.  You notice increased swelling in your face, hands, legs, or ankles.  You have spotting or bleeding from your vagina.  You have pelvic cramps, pelvic pressure, or nagging pain in your abdomen or lower back.  You are having regular contractions.  You develop a severe headache, with or without visual changes.  You have shortness of breath or chest pain.  You notice less fetal movement, or no fetal movement. This information is not intended to replace advice given to you by your health care provider. Make sure you discuss any questions you have with your health care provider. Document Released: 11/19/2007 Document Revised: 10/11/2015 Document Reviewed: 10/11/2015 Elsevier Interactive Patient Education  2017 [employer]  

## 2016-05-22 LAB — URINE CULTURE, OB REFLEX

## 2016-05-22 LAB — CULTURE, OB URINE

## 2016-05-25 ENCOUNTER — Encounter: Payer: Self-pay | Admitting: Certified Nurse Midwife

## 2016-06-19 ENCOUNTER — Encounter: Payer: Self-pay | Admitting: Certified Nurse Midwife

## 2016-06-19 ENCOUNTER — Ambulatory Visit (INDEPENDENT_AMBULATORY_CARE_PROVIDER_SITE_OTHER): Payer: Medicaid Other | Admitting: Certified Nurse Midwife

## 2016-06-19 VITALS — BP 104/54 | HR 97 | Wt 173.5 lb

## 2016-06-19 DIAGNOSIS — O34219 Maternal care for unspecified type scar from previous cesarean delivery: Secondary | ICD-10-CM

## 2016-06-19 DIAGNOSIS — Z87898 Personal history of other specified conditions: Secondary | ICD-10-CM

## 2016-06-19 DIAGNOSIS — Z3482 Encounter for supervision of other normal pregnancy, second trimester: Secondary | ICD-10-CM

## 2016-06-19 DIAGNOSIS — F1291 Cannabis use, unspecified, in remission: Secondary | ICD-10-CM

## 2016-06-19 DIAGNOSIS — O30049 Twin pregnancy, dichorionic/diamniotic, unspecified trimester: Secondary | ICD-10-CM

## 2016-06-19 LAB — POCT URINALYSIS DIPSTICK
BILIRUBIN UA: NEGATIVE
GLUCOSE UA: NEGATIVE
Ketones, UA: NEGATIVE
LEUKOCYTES UA: NEGATIVE
NITRITE UA: NEGATIVE
Spec Grav, UA: 1.02 (ref 1.010–1.025)
UROBILINOGEN UA: 0.2 U/dL
pH, UA: 6 (ref 5.0–8.0)

## 2016-06-19 NOTE — Patient Instructions (Addendum)
Second Trimester of Pregnancy The second trimester is from week 13 through week 28, month 4 through 6. This is often the time in pregnancy that you feel your best. Often times, morning sickness has lessened or quit. You may have more energy, and you may get hungry more often. Your unborn baby (fetus) is growing rapidly. At the end of the sixth month, he or she is about 9 inches long and weighs about 1 pounds. You will likely feel the baby move (quickening) between 18 and 20 weeks of pregnancy. Follow these instructions at home:  Avoid all smoking, herbs, and alcohol. Avoid drugs not approved by your doctor.  Do not use any tobacco products, including cigarettes, chewing tobacco, and electronic cigarettes. If you need help quitting, ask your doctor. You may get counseling or other support to help you quit.  Only take medicine as told by your doctor. Some medicines are safe and some are not during pregnancy.  Exercise only as told by your doctor. Stop exercising if you start having cramps.  Eat regular, healthy meals.  Wear a good support bra if your breasts are tender.  Do not use hot tubs, steam rooms, or saunas.  Wear your seat belt when driving.  Avoid raw meat, uncooked cheese, and liter boxes and soil used by cats.  Take your prenatal vitamins.  Take 1500-2000 milligrams of calcium daily starting at the 20th week of pregnancy until you deliver your baby.  Try taking medicine that helps you poop (stool softener) as needed, and if your doctor approves. Eat more fiber by eating fresh fruit, vegetables, and whole grains. Drink enough fluids to keep your pee (urine) clear or pale yellow.  Take warm water baths (sitz baths) to soothe pain or discomfort caused by hemorrhoids. Use hemorrhoid cream if your doctor approves.  If you have puffy, bulging veins (varicose veins), wear support hose. Raise (elevate) your feet for 15 minutes, 3-4 times a day. Limit salt in your diet.  Avoid heavy  lifting, wear low heals, and sit up straight.  Rest with your legs raised if you have leg cramps or low back pain.  Visit your dentist if you have not gone during your pregnancy. Use a soft toothbrush to brush your teeth. Be gentle when you floss.  You can have sex (intercourse) unless your doctor tells you not to.  Go to your doctor visits. Get help if:  You feel dizzy.  You have mild cramps or pressure in your lower belly (abdomen).  You have a nagging pain in your belly area.  You continue to feel sick to your stomach (nauseous), throw up (vomit), or have watery poop (diarrhea).  You have bad smelling fluid coming from your vagina.  You have pain with peeing (urination). Get help right away if:  You have a fever.  You are leaking fluid from your vagina.  You have spotting or bleeding from your vagina.  You have severe belly cramping or pain.  You lose or gain weight rapidly.  You have trouble catching your breath and have chest pain.  You notice sudden or extreme puffiness (swelling) of your face, hands, ankles, feet, or legs.  You have not felt the baby move in over an hour.  You have severe headaches that do not go away with medicine.  You have vision changes. This information is not intended to replace advice given to you by your health care provider. Make sure you discuss any questions you have with your health care   provider. Document Released: 05/06/2009 Document Revised: 07/18/2015 Document Reviewed: 04/12/2012 Elsevier Interactive Patient Education  2017 [employer] Round Ligament Pain The round ligament is a cord of muscle and tissue that helps to support the uterus. It can become a source of pain during pregnancy if it becomes stretched or twisted as the baby grows. The pain usually begins in the second trimester of pregnancy, and it can come and go until the baby is delivered. It is not a serious problem, and it does not cause harm to the baby. Round  ligament pain is usually a short, sharp, and pinching pain, but it can also be a dull, lingering, and aching pain. The pain is felt in the lower side of the abdomen or in the groin. It usually starts deep in the groin and moves up to the outside of the hip area. Pain can occur with:  A sudden change in position.  Rolling over in bed.  Coughing or sneezing.  Physical activity. Follow these instructions at home: Watch your condition for any changes. Take these steps to help with your pain:  When the pain starts, relax. Then try:  Sitting down.  Flexing your knees up to your abdomen.  Lying on your side with one pillow under your abdomen and another pillow between your legs.  Sitting in a warm bath for 15-20 minutes or until the pain goes away.  Take over-the-counter and prescription medicines only as told by your health care provider.  Move slowly when you sit and stand.  Avoid long walks if they cause pain.  Stop or lessen your physical activities if they cause pain. Contact a health care provider if:  Your pain does not go away with treatment.  You feel pain in your back that you did not have before.  Your medicine is not helping. Get help right away if:  You develop a fever or chills.  You develop uterine contractions.  You develop vaginal bleeding.  You develop nausea or vomiting.  You develop diarrhea.  You have pain when you urinate. This information is not intended to replace advice given to you by your health care provider. Make sure you discuss any questions you have with your health care provider. Document Released: 11/19/2007 Document Revised: 07/18/2015 Document Reviewed: 04/18/2014 Elsevier Interactive Patient Education  2017 [employer]. Multiple Pregnancy Having a multiple pregnancy means that a woman is carrying more than one baby at a time. She may be pregnant with twins, triplets, or more. The majority of multiple pregnancies are twins. Naturally  conceiving triplets or more (higher-order multiples) is rare. Multiple pregnancies are riskier than single pregnancies. A woman with a multiple pregnancy is more likely to have certain problems during her pregnancy. Therefore, she will need to have more frequent appointments for prenatal care. How does a multiple pregnancy happen? A multiple pregnancy happens when:  The woman's body releases more than one egg at a time, and then each egg gets fertilized by a different sperm.  This is the most common type of multiple pregnancy.  Twins or other multiples produced this way are fraternal. They are no more alike than non-multiple siblings are.  One sperm fertilizes one egg, which then divides into more than one embryo.  Twins or other multiples produced this way are identical. Identical multiples are always the same gender, and they look very much alike. Who is most likely to have a multiple pregnancy? A multiple pregnancy is more likely to develop in women who:  Have  had fertility treatment, especially if the treatment included fertility drugs.  Are older than 30 years of age.  Have already had four or more children.  Have a family history of multiple pregnancy. How is a multiple pregnancy diagnosed? A multiple pregnancy may be diagnosed based on:  Symptoms such as:  Rapid weight gain in the first 3 months of pregnancy (first trimester).  More severe nausea and breast tenderness than what is typical of a single pregnancy.  The uterus measuring larger than what is normal for the stage of the pregnancy.  Blood tests that detect a higher-than-normal level of human chorionic gonadotropin (hCG). This is a hormone that your body produces in early pregnancy.  Ultrasound exam. This is used to confirm that you are carrying multiples. What risks are associated with multiple pregnancy? A multiple pregnancy puts you at a higher risk for certain problems during or after your pregnancy,  including:  Having your babies delivered before you have reached a full-term pregnancy (preterm birth). A full-term pregnancy lasts for at least 37 weeks. Babies born before 37 weeks may have a higher risk of a variety of health problems, such as breathing problems, feeding difficulties, cerebral palsy, and learning disabilities.  Diabetes.  Preeclampsia. This is a serious condition that causes high blood pressure along with other symptoms, such as swelling and headaches, during pregnancy.  Excessive blood loss after childbirth (postpartum hemorrhage).  Postpartum depression.  Low birth weight of the babies. How will having a multiple pregnancy affect my care? Your health care provider will want to monitor you more closely during your pregnancy to make sure that your babies are growing normally and that you are healthy. Follow these instructions at home: Because your pregnancy is considered to be high risk, you will need to work closely with your health care team. You may also need to make some lifestyle changes. These may include the following: Eating and drinking   Increase your nutrition.  Follow your health care provider's recommendations for weight gain. You may need to gain a little extra weight when you are pregnant with multiples.  Eat healthy snacks often throughout the day. This can add calories and reduce nausea.  Drink enough fluid to keep your urine clear or pale yellow.  Take prenatal vitamins. Activity  By 20-24 weeks, you may need to limit your activities.  Avoid activities and work that take a lot of effort (are strenuous).  Ask your health care provider when you should stop having sexual intercourse.  Rest often. General instructions   Do not use any products that contain nicotine or tobacco, such as cigarettes and e-cigarettes. If you need help quitting, ask your health care provider.  Do not drink alcohol or use illegal drugs.  Take over-the-counter and  prescription medicines only as told by your health care provider.  Arrange for extra help around the house.  Keep all follow-up visits and all prenatal visits as told by your health care provider. This is important. Contact a health care provider if:  You have dizziness.  You have persistent nausea, vomiting, or diarrhea.  You are having trouble gaining weight.  You have feelings of depression or other emotions that are interfering with your normal activities. Get help right away if:  You have a fever.  You have pain with urination.  You have fluid leaking from your vagina.  You have a bad-smelling vaginal discharge.  You notice increased swelling in your face, hands, legs, or ankles.  You have spotting  or bleeding from your vagina.  You have pelvic cramps, pelvic pressure, or nagging pain in your abdomen or lower back.  You are having regular contractions.  You develop a severe headache, with or without visual changes.  You have shortness of breath or chest pain.  You notice less fetal movement, or no fetal movement. This information is not intended to replace advice given to you by your health care provider. Make sure you discuss any questions you have with your health care provider. Document Released: 11/19/2007 Document Revised: 10/11/2015 Document Reviewed: 10/11/2015 Elsevier Interactive Patient Education  2017 [employer].

## 2016-06-19 NOTE — Progress Notes (Signed)
ROB-Pt doing well. Discussed safe sleep, round ligament pain, and home treatment measures. Anatomy scan scheduled 07/01/2016. Reviewed red flag symptoms and when to call. RTC x 4 weeks for ROB.

## 2016-06-26 ENCOUNTER — Other Ambulatory Visit: Payer: Self-pay | Admitting: Certified Nurse Midwife

## 2016-06-26 DIAGNOSIS — Z369 Encounter for antenatal screening, unspecified: Secondary | ICD-10-CM

## 2016-06-26 DIAGNOSIS — O30042 Twin pregnancy, dichorionic/diamniotic, second trimester: Secondary | ICD-10-CM

## 2016-07-01 ENCOUNTER — Ambulatory Visit (INDEPENDENT_AMBULATORY_CARE_PROVIDER_SITE_OTHER): Payer: Medicaid Other

## 2016-07-01 DIAGNOSIS — Z369 Encounter for antenatal screening, unspecified: Secondary | ICD-10-CM

## 2016-07-01 DIAGNOSIS — O30042 Twin pregnancy, dichorionic/diamniotic, second trimester: Secondary | ICD-10-CM | POA: Diagnosis not present

## 2016-07-01 DIAGNOSIS — Z3482 Encounter for supervision of other normal pregnancy, second trimester: Secondary | ICD-10-CM

## 2016-07-17 ENCOUNTER — Ambulatory Visit (INDEPENDENT_AMBULATORY_CARE_PROVIDER_SITE_OTHER): Payer: Medicaid Other | Admitting: Obstetrics and Gynecology

## 2016-07-17 VITALS — BP 122/54 | HR 101 | Wt 180.3 lb

## 2016-07-17 DIAGNOSIS — Z3492 Encounter for supervision of normal pregnancy, unspecified, second trimester: Secondary | ICD-10-CM

## 2016-07-17 LAB — POCT URINALYSIS DIPSTICK
BILIRUBIN UA: NEGATIVE
Blood, UA: NEGATIVE
GLUCOSE UA: NEGATIVE
KETONES UA: NEGATIVE
LEUKOCYTES UA: NEGATIVE
NITRITE UA: NEGATIVE
Protein, UA: NEGATIVE
Spec Grav, UA: 1.01 (ref 1.010–1.025)
Urobilinogen, UA: 0.2 E.U./dL
pH, UA: 6 (ref 5.0–8.0)

## 2016-07-17 NOTE — Progress Notes (Signed)
ROB- pt is doing well 

## 2016-07-17 NOTE — Progress Notes (Signed)
ROB-` Doing well, no complaints, will schedule growth scan.                                ``` `               `

## 2016-08-07 ENCOUNTER — Other Ambulatory Visit: Payer: Self-pay | Admitting: Obstetrics and Gynecology

## 2016-08-07 DIAGNOSIS — O30043 Twin pregnancy, dichorionic/diamniotic, third trimester: Secondary | ICD-10-CM

## 2016-08-14 ENCOUNTER — Ambulatory Visit: Payer: Medicaid Other

## 2016-08-14 ENCOUNTER — Encounter: Payer: Self-pay | Admitting: Certified Nurse Midwife

## 2016-08-14 ENCOUNTER — Ambulatory Visit (INDEPENDENT_AMBULATORY_CARE_PROVIDER_SITE_OTHER): Payer: Medicaid Other | Admitting: Certified Nurse Midwife

## 2016-08-14 VITALS — BP 99/61 | HR 100 | Wt 185.3 lb

## 2016-08-14 DIAGNOSIS — Z130 Encounter for screening for diseases of the blood and blood-forming organs and certain disorders involving the immune mechanism: Secondary | ICD-10-CM

## 2016-08-14 DIAGNOSIS — O30043 Twin pregnancy, dichorionic/diamniotic, third trimester: Secondary | ICD-10-CM

## 2016-08-14 DIAGNOSIS — Z87898 Personal history of other specified conditions: Secondary | ICD-10-CM

## 2016-08-14 DIAGNOSIS — Z3492 Encounter for supervision of normal pregnancy, unspecified, second trimester: Secondary | ICD-10-CM

## 2016-08-14 DIAGNOSIS — Z131 Encounter for screening for diabetes mellitus: Secondary | ICD-10-CM

## 2016-08-14 DIAGNOSIS — O30049 Twin pregnancy, dichorionic/diamniotic, unspecified trimester: Secondary | ICD-10-CM

## 2016-08-14 DIAGNOSIS — F1291 Cannabis use, unspecified, in remission: Secondary | ICD-10-CM

## 2016-08-14 DIAGNOSIS — Z23 Encounter for immunization: Secondary | ICD-10-CM

## 2016-08-14 LAB — POCT URINALYSIS DIPSTICK
BILIRUBIN UA: NEGATIVE
Blood, UA: NEGATIVE
GLUCOSE UA: NEGATIVE
KETONES UA: NEGATIVE
LEUKOCYTES UA: NEGATIVE
Nitrite, UA: NEGATIVE
PROTEIN UA: NEGATIVE
Urobilinogen, UA: 0.2 E.U./dL
pH, UA: 7.5 (ref 5.0–8.0)

## 2016-08-14 MED ORDER — TETANUS-DIPHTH-ACELL PERTUSSIS 5-2.5-18.5 LF-MCG/0.5 IM SUSP
0.5000 mL | Freq: Once | INTRAMUSCULAR | Status: AC
  Administered 2016-08-14: 0.5 mL via INTRAMUSCULAR

## 2016-08-14 NOTE — Progress Notes (Signed)
ROB- gtt, btc, tdap, and growth scan. 28 wk drug screen ordered.

## 2016-08-14 NOTE — Progress Notes (Signed)
Rob, doing well with no complaints. Discussed that next visit with MD to "meet and greet" discuss repeat c/section. Reviewed growth ultrasound ( see below). Follow up in 2 wks. PTL precautions reviewed.   Doreene BurkeAnnie Sandford Diop, CNM ULTRASOUND REPORT  Location: [facility] Date of Service: 08/14/16  Indications: Di-Di Twin- EFW and AFI Findings:   TWIN A: Di-Di twin intrauterine pregnancy is visualized with FHR at 144 BPM. Biometrics give an (U/S) Gestational age of [redacted] weeks 1 day and an (U/S) EDD of 11/12/16; this correlates with the clinically established EDD of 11/13/16.  Fetal presentation is vertex, spine anterior.  EFW: 1010 grams ( 2 lbs. 4 oz) 47th percentile for growth, Williams. Placenta: Anterior, grade 1. AFI: Subjectively adequate with an MVP of 6.8 cm.  Anatomic survey of the fetal lateral ventricle, stomach, bladder and kidneys appears WNL. Gender - Female.   TWIN B: Di-Di twin intrauterine pregnancy is visualized with FHR at 153 BPM. Biometrics give an (U/S) Gestational age of [redacted] weeks and an (U/S) EDD of 11/13/16; this correlates with the clinically established EDD of 11/13/16.  Fetal presentation is vertex, spine left lateral.  EFW: 1027 grams ( 2 lbs. 4 oz) 49th percentile for growth, Williams. Placenta: Posterior, grade 1. AFI: Subjectively adequate with an MVP of 5.5 cm.  Anatomic survey of the fetal lateral ventricle, stomach, bladder and kidneys appears WNL. Gender - Female.   Impression: 1. 27 week Viable Di-Di twin Intrauterine pregnancy by U/S. 2. (U/S) EDD is consistent with Clinically established (LMP) EDD of 11/13/16. 3. See body of note for Twin A and Twin B's perspective EFW's.  Recommendations: 1.Clinical correlation with the patient's History and Physical Exam.   Revonda Humphreyeresa Elliott, RDMS, RVT

## 2016-08-14 NOTE — Patient Instructions (Signed)

## 2016-08-15 LAB — DRUG PROFILE, UR, 9 DRUGS (LABCORP)
Amphetamines, Urine: NEGATIVE ng/mL
BARBITURATE QUANT UR: NEGATIVE ng/mL
BENZODIAZEPINE QUANT UR: NEGATIVE ng/mL
CANNABINOID QUANT UR: NEGATIVE ng/mL
Cocaine (Metab.): NEGATIVE ng/mL
Methadone Screen, Urine: NEGATIVE ng/mL
Opiate Quant, Ur: NEGATIVE ng/mL
PCP Quant, Ur: NEGATIVE ng/mL
Propoxyphene: NEGATIVE ng/mL

## 2016-08-15 LAB — HEMOGLOBIN AND HEMATOCRIT, BLOOD
HEMATOCRIT: 31.7 % — AB (ref 34.0–46.6)
HEMOGLOBIN: 10.6 g/dL — AB (ref 11.1–15.9)

## 2016-08-15 LAB — GLUCOSE, 1 HOUR GESTATIONAL: Gestational Diabetes Screen: 121 mg/dL (ref 65–139)

## 2016-08-17 ENCOUNTER — Encounter: Payer: Self-pay | Admitting: Certified Nurse Midwife

## 2016-08-21 ENCOUNTER — Telehealth: Payer: Self-pay | Admitting: Certified Nurse Midwife

## 2016-08-21 NOTE — Telephone Encounter (Signed)
Patient needs to know if she should continue the iron supplement if she begins a prenatal with iron included  Please call

## 2016-08-21 NOTE — Telephone Encounter (Signed)
Tammy Leon,   Could you please tell Tammy Leon that we need her to take additional iron because her blood count is low and that prenatal vitamin already contain iron but we would like it increased to approximately 975 mg a day or 65 mg of elemental iron a day.   Thanks,  Tammy Leon

## 2016-09-01 ENCOUNTER — Ambulatory Visit (INDEPENDENT_AMBULATORY_CARE_PROVIDER_SITE_OTHER): Payer: Medicaid Other | Admitting: Obstetrics and Gynecology

## 2016-09-01 ENCOUNTER — Encounter: Payer: Self-pay | Admitting: Obstetrics and Gynecology

## 2016-09-01 VITALS — BP 94/59 | HR 96 | Wt 191.7 lb

## 2016-09-01 DIAGNOSIS — O30043 Twin pregnancy, dichorionic/diamniotic, third trimester: Secondary | ICD-10-CM

## 2016-09-01 DIAGNOSIS — Z98891 History of uterine scar from previous surgery: Secondary | ICD-10-CM

## 2016-09-01 LAB — POCT URINALYSIS DIPSTICK
Bilirubin, UA: NEGATIVE
Blood, UA: NEGATIVE
Glucose, UA: NEGATIVE
Ketones, UA: NEGATIVE
Leukocytes, UA: NEGATIVE
Nitrite, UA: NEGATIVE
Protein, UA: NEGATIVE
Spec Grav, UA: <=1.005 — AB
Urobilinogen, UA: 0.2 U/dL
pH, UA: 7

## 2016-09-01 NOTE — Progress Notes (Signed)
ROB: Patient presents for consultation to discuss repeat C-section vs TOLAC, currently with di-di twins.  She is currently leaning towards repeat C-section as she is concerned about having to have a c-section for 2nd twin if she labors. Reviewed last ultrasound, both twins currently vertex. Discussed that as long as both twins remain vertex, there is a low likelihood of version to breech of second twin after delivery of first twin. Also discussed options for internal and external version, or breech delivery if second twin does turn, prior to requiring a C-section.  Patient more open to considering TOLAC at this point.  She notes 1st C-section was performed for repetitive fetal decelerations in early latent labor.  Patient is now more open to considering TOLAC.  Desires to discuss with her husband.  Advised that if patient does desire to continue with scheduling for C-section, she is to f/u with MD service at 34 weeks so that she can be scheduled for C-section at 37 weeks. Also notes that she thinks this will be her last pregnancy, but is unsure about permanent sterilization.  To discuss LARC and other options again next visit. Reviewed 28 week labs with patient. To f/u with midwives in 2 weeks. She is also due for next growth scan at that time. Information given on birthing classes and LD tours at patient's request.

## 2016-09-01 NOTE — Addendum Note (Signed)
Addended by: Fabian NovemberHERRY, Dusan Lipford S on: 09/01/2016 09:03 PM   Modules accepted: Orders

## 2016-09-01 NOTE — Patient Instructions (Addendum)
[facility] 2018 Prenatal Education Class Schedule Register at [email or web address] in the Classes & Resources Link or call [facility] Line at [phone] 9:00a-5:00p M-F  Childbirth Preparation Certified Childbirth Educators teach this 5 week course.  Expectant parents are encouraged to take this class in their 3rd trimester, completing it by their 35-36th week. Meets in [facility], Lower Level.  Mondays Thursdays  7:00-9:00 p 7:00-9:00 p  July 23 - August 20 July 19 - August 16  September 17 - October 15 September 6 -October 4  November 5 - December 3 October 25 - November 29   No Class on Thanksgiving Day -November 22  Childbirth Preparation Refresher Course For those who have previously attended Prepared Childbirth Preparation classes, this class in incorporated into the 3rd and 4th classes in the Monday night childbirth series.  Course meets in the [facility]. Lower Level from 7:00p - 9:00p  August 6 & 13  October 1 & 8  November 19 & 26   Weekend Childbirth Blitz Classes are held Saturday & Sunday, 1:00 5:00p Course meets in [facility] Lower Level  August 4 & 5  November 3 & 4    The [facility] Tours Free tours are held on the third Sunday of each month at 3 pm.  The tour meets in the third floor waiting area and will take approximately 30 minutes.  Tours are also included in Childbirth class series as well as Brother/Sister class.  An online virtual tour can be seen at [email or web address].         Breastfeeding & Infant Nutrition The course incorporates returning to work or school.  Breast milk collection and storage with basic breastfeeding and infant nutrition. This two-class course is held the 2nd and 3rd Tuesday of each month from 7:00 -9:00 pm.  Course meets in the [facility] Lower level  June 12 & 19 July-No Class  August 14 & 21 September 11 &18  September 11 & 18 October 9 &16  November 13 &  20 December 11 & 18   [facility] [employer] [facility] welcomes any mother for a social outing with other Moms to share experiences and challenges in an informal setting.  Meets the 1st Thursday and 3rd Thursday 11:30a-1:00 pm of each month in [facility] 3rd floor classroom.  No registration required.  Newborn Essentials This course covers bathing, diapering, swaddling and more with practice on lifelike dolls.  Participants will also learn safety tips and infant CPR (Not for certification).  It is held the 1st Wednesday of each month from 7:00p-9:00p in the [facility], Lower level.  June 6 July- No Class  August 1 September 5  October 3 November 7  December 7    Preparing Big Brother & Sister This one session course prepares children and their parents for the arrival of a new baby.  It is held on the 1st Tuesday of each month from 6:30p - 8:00p. Course meets in the [facility], Lower level.  July-No Class August 7  September 4 October 2  November 6 December 4   Boot Camp for New Dads This nationally acclaimed class helps expecting and new dads with the basic skills and confidence to bond with their infants, support their mates, and provide a safe and healthy home environment for their new family. Classes are held the 2nd Saturday of every month from 9:00a - 12:00 noon.  Course meets in the [facility] Lower level.    June 9 August 11  October 13 No Class in December   

## 2016-09-16 ENCOUNTER — Ambulatory Visit (INDEPENDENT_AMBULATORY_CARE_PROVIDER_SITE_OTHER): Payer: Medicaid Other | Admitting: Obstetrics and Gynecology

## 2016-09-16 ENCOUNTER — Ambulatory Visit (INDEPENDENT_AMBULATORY_CARE_PROVIDER_SITE_OTHER): Payer: Medicaid Other

## 2016-09-16 VITALS — BP 116/66 | HR 98 | Wt 192.0 lb

## 2016-09-16 DIAGNOSIS — O30043 Twin pregnancy, dichorionic/diamniotic, third trimester: Secondary | ICD-10-CM | POA: Diagnosis not present

## 2016-09-16 DIAGNOSIS — Z3493 Encounter for supervision of normal pregnancy, unspecified, third trimester: Secondary | ICD-10-CM

## 2016-09-16 LAB — POCT URINALYSIS DIPSTICK
Bilirubin, UA: NEGATIVE
Blood, UA: NEGATIVE
GLUCOSE UA: NEGATIVE
KETONES UA: NEGATIVE
LEUKOCYTES UA: NEGATIVE
Nitrite, UA: NEGATIVE
Protein, UA: NEGATIVE
SPEC GRAV UA: 1.01 (ref 1.010–1.025)
Urobilinogen, UA: 0.2 E.U./dL
pH, UA: 6 (ref 5.0–8.0)

## 2016-09-16 NOTE — Progress Notes (Signed)
ROB- pt is doing well, denies any complaints, growth scan done today

## 2016-09-16 NOTE — Progress Notes (Signed)
ROB and growth scan: reviewed  U/s , discussed BC, considering BTL or IUD will sign medicaid papers today.   Indications: Growth and AFI for Di-Di Twins  Findings:   BABY A: Di-Di twin intrauterine pregnancy is visualized with FHR at 149 BPM. Biometrics give an (U/S) Gestational age of [redacted] weeks and an (U/S) EDD of 11/18/16; this correlates with the clinically established EDD of 11/13/16.  Fetal presentation is Maternal right, vertex, spine posterior.  EFW: 1683 grams ( 3 lbs. 11 oz.) 37th percentile for growth, Williams Placenta: Anterior, grade 1. AFI: MVP is 4.7 cm  Anatomic survey of the fetal stomach, bladder and kidneys appears WNL. Gender - Female.   BABY B: Di-Di twin intrauterine pregnancy is visualized with FHR at 149 BPM. Biometrics give an (U/S) Gestational age of [redacted] weeks 5 days, and an (U/S) EDD of 11/20/16; this correlates with the clinically established EDD of 11/13/16.  Fetal presentation is Maternal left, vertex, spine anterior.  EFW: 1632 grams ( 3 lbs. 10 oz.) 34th percentile for growth, Williams Placenta: Posterior, grade 1. AFI: MVP is 4.3 cm  Anatomic survey of the fetal stomach, bladder and kidneys appears WNL. Gender - Female.     Impression: 1. 31 weeks 5 day Viable Di-Di twin Intrauterine pregnancy by U/S. 2. (U/S) EDD's are consistent with Clinically established (LMP) EDD of 11/13/16

## 2016-09-30 ENCOUNTER — Ambulatory Visit (INDEPENDENT_AMBULATORY_CARE_PROVIDER_SITE_OTHER): Payer: Medicaid Other | Admitting: Obstetrics and Gynecology

## 2016-09-30 VITALS — BP 109/65 | HR 87 | Wt 199.9 lb

## 2016-09-30 DIAGNOSIS — O30043 Twin pregnancy, dichorionic/diamniotic, third trimester: Secondary | ICD-10-CM

## 2016-09-30 DIAGNOSIS — O34219 Maternal care for unspecified type scar from previous cesarean delivery: Secondary | ICD-10-CM

## 2016-09-30 NOTE — Progress Notes (Signed)
ROB: Patient doing well, no complaints. Notes good fetal movement x 2. Desires to go ahead and proceed with scheduling her C-section.  Was considering TOLAC, after last consultation but has reconsidered again.  Would like to schedule her C-section at approximately 38 weeks (although it was discussed with her that typically repeat C-sections for twins are scheduled at ~ 37 weeks due to risk of spontaneous labor if she makes it to term). Reviewed again risks/benefits of C-section.  Will schedule for 10/30/2016 and perform pre-op today.  RTC in 2 weeks with midwives.  If patient has any more questions regarding C-section, can schedule with MD on week of scheduled C-section.

## 2016-10-01 ENCOUNTER — Encounter: Payer: Self-pay | Admitting: Obstetrics and Gynecology

## 2016-10-01 ENCOUNTER — Telehealth: Payer: Self-pay | Admitting: Obstetrics and Gynecology

## 2016-10-01 DIAGNOSIS — O30043 Twin pregnancy, dichorionic/diamniotic, third trimester: Secondary | ICD-10-CM

## 2016-10-01 LAB — POCT URINALYSIS DIPSTICK
BILIRUBIN UA: NEGATIVE
GLUCOSE UA: NEGATIVE
Ketones, UA: NEGATIVE
LEUKOCYTES UA: NEGATIVE
NITRITE UA: NEGATIVE
Protein, UA: NEGATIVE
Spec Grav, UA: <=1.005 — AB (ref 1.010–1.025)
Urobilinogen, UA: 0.2 E.U./dL
pH, UA: 7.5 (ref 5.0–8.0)

## 2016-10-01 NOTE — Telephone Encounter (Signed)
Patient was unable to schedule future appointment due to the computers being down 09/30/2016. I called the patient to schedule future OB appointment with the midwives, She is "certain" that she needs an U/S at her next appointment and I informed her that, that was not in her disposition. If you could please let me know what to do moving forward for the patient. Thank you. Please advise.

## 2016-10-01 NOTE — Telephone Encounter (Signed)
Does this pt need a growth scan? Please advise.

## 2016-10-02 NOTE — Telephone Encounter (Signed)
Yes she does.  I thought I put it in there but I guess not.  Its for growth for twins. I put the order in.

## 2016-10-02 NOTE — Telephone Encounter (Signed)
Please schedule pt accordingly. Thanks

## 2016-10-15 ENCOUNTER — Ambulatory Visit (INDEPENDENT_AMBULATORY_CARE_PROVIDER_SITE_OTHER): Payer: Medicaid Other

## 2016-10-15 ENCOUNTER — Ambulatory Visit (INDEPENDENT_AMBULATORY_CARE_PROVIDER_SITE_OTHER): Payer: Medicaid Other | Admitting: Certified Nurse Midwife

## 2016-10-15 ENCOUNTER — Encounter: Payer: Self-pay | Admitting: Certified Nurse Midwife

## 2016-10-15 VITALS — BP 119/83 | HR 80 | Wt 198.5 lb

## 2016-10-15 DIAGNOSIS — O99323 Drug use complicating pregnancy, third trimester: Secondary | ICD-10-CM

## 2016-10-15 DIAGNOSIS — O30043 Twin pregnancy, dichorionic/diamniotic, third trimester: Secondary | ICD-10-CM

## 2016-10-15 DIAGNOSIS — Z369 Encounter for antenatal screening, unspecified: Secondary | ICD-10-CM

## 2016-10-15 DIAGNOSIS — Z3493 Encounter for supervision of normal pregnancy, unspecified, third trimester: Secondary | ICD-10-CM

## 2016-10-15 DIAGNOSIS — R8299 Other abnormal findings in urine: Secondary | ICD-10-CM

## 2016-10-15 DIAGNOSIS — Z113 Encounter for screening for infections with a predominantly sexual mode of transmission: Secondary | ICD-10-CM

## 2016-10-15 DIAGNOSIS — R82998 Other abnormal findings in urine: Secondary | ICD-10-CM

## 2016-10-15 LAB — POCT URINALYSIS DIPSTICK
Bilirubin, UA: NEGATIVE
Blood, UA: NEGATIVE
Glucose, UA: NEGATIVE
KETONES UA: NEGATIVE
NITRITE UA: NEGATIVE
PH UA: 6 (ref 5.0–8.0)
PROTEIN UA: NEGATIVE
Spec Grav, UA: 1.01 (ref 1.010–1.025)
UROBILINOGEN UA: 0.2 U/dL

## 2016-10-15 NOTE — Patient Instructions (Signed)
Nonstress Test The nonstress test is a procedure that monitors the fetus's heartbeat. The test will monitor the heartbeat when the fetus is at rest and while the fetus is moving. In a healthy fetus, there will be an increase in fetal heart rate when the fetus moves or kicks. The heart rate will decrease at rest. This test helps determine if the fetus is healthy. Your health care provider will look at a number of patterns in the heart rate tracing to make sure your baby is thriving. If there is concern, your health care provider may order additional tests or may suggest another course of action. This test is often done in the third trimester and can help determine if an early delivery is needed and safe. Common reasons to have this test are:  You are past your due date.  You have a high-risk pregnancy.  You are feeling less movement than normal.  You have lost a pregnancy in the past.  Your health care provider suspects fetal growth problems.  You have too much or too little amniotic fluid.  What happens before the procedure?  Eat a meal right before the test or as directed by your health care provider. Food may help stimulate fetal movements.  Use the restroom right before the test. What happens during the procedure?  Two belts will be placed around your abdomen. These belts have monitors attached to them. One records the fetal heart rate and the other records uterine contractions.  You may be asked to lie down on your side or to stay sitting upright.  You may be given a button to press when you feel movement.  The fetal heartbeat is listened to and watched on a screen. The heartbeat is recorded on a sheet of paper.  If the fetus seems to be sleeping, you may be asked to drink some juice or soda, gently press your abdomen, or make some noise to wake the fetus. What happens after the procedure? Your health care provider will discuss the test results with you and make recommendations  for the near future.  This information is not intended to replace advice given to you by your health care provider. Make sure you discuss any questions you have with your health care provider. This information is not intended to replace advice given to you by your health care provider. Make sure you discuss any questions you have with your health care provider. Document Released: 01/30/2002 Document Revised: 01/10/2016 Document Reviewed: 03/15/2012 Elsevier Interactive Patient Education  2018 Elsevier Inc.  

## 2016-10-16 ENCOUNTER — Other Ambulatory Visit: Payer: Self-pay | Admitting: Obstetrics and Gynecology

## 2016-10-16 DIAGNOSIS — O30003 Twin pregnancy, unspecified number of placenta and unspecified number of amniotic sacs, third trimester: Secondary | ICD-10-CM

## 2016-10-16 DIAGNOSIS — O30043 Twin pregnancy, dichorionic/diamniotic, third trimester: Secondary | ICD-10-CM

## 2016-10-16 LAB — MONITOR DRUG PROFILE 14(MW)
Amphetamine Scrn, Ur: NEGATIVE ng/mL
BARBITURATE SCREEN URINE: NEGATIVE ng/mL
BENZODIAZEPINE SCREEN, URINE: NEGATIVE ng/mL
BUPRENORPHINE, URINE: NEGATIVE ng/mL
CANNABINOIDS UR QL SCN: NEGATIVE ng/mL
CREATININE(CRT), U: 23.2 mg/dL (ref 20.0–300.0)
Cocaine (Metab) Scrn, Ur: NEGATIVE ng/mL
Fentanyl, Urine: NEGATIVE pg/mL
METHADONE SCREEN, URINE: NEGATIVE ng/mL
Meperidine Screen, Urine: NEGATIVE ng/mL
OXYCODONE+OXYMORPHONE UR QL SCN: NEGATIVE ng/mL
Opiate Scrn, Ur: NEGATIVE ng/mL
PH UR, DRUG SCRN: 6.2 (ref 4.5–8.9)
PHENCYCLIDINE QUANTITATIVE URINE: NEGATIVE ng/mL
PROPOXYPHENE SCREEN URINE: NEGATIVE ng/mL
SPECIFIC GRAVITY: 1.007
Tramadol Screen, Urine: NEGATIVE ng/mL

## 2016-10-17 LAB — STREP GP B NAA: Strep Gp B NAA: NEGATIVE

## 2016-10-17 LAB — URINE CULTURE, OB REFLEX

## 2016-10-17 LAB — GC/CHLAMYDIA PROBE AMP
CHLAMYDIA, DNA PROBE: NEGATIVE
NEISSERIA GONORRHOEAE BY PCR: NEGATIVE

## 2016-10-17 LAB — CULTURE, OB URINE

## 2016-10-18 NOTE — Progress Notes (Signed)
ROB-Pt doing well, no questions or concerns. Fetal movement x 2. Repeat c-section scheduled for  Friday, 10/30/2016. Advised Zantac 150 mg BID for reflux. 36 week cultures collected today. Repeat UDS for positive THC on New OB labs. Continue daily kick counts. Reviewed red flag symptoms and when to call. NST at [facility] on Monday. RTC x 1 week for BPP and ROB or sooner if needed.

## 2016-10-19 ENCOUNTER — Observation Stay
Admission: EM | Admit: 2016-10-19 | Discharge: 2016-10-19 | Disposition: A | Discharge status: Home / Self Care | Payer: Medicaid Other | Attending: Obstetrics and Gynecology | Admitting: Obstetrics and Gynecology

## 2016-10-19 ENCOUNTER — Encounter: Payer: Self-pay | Admitting: *Deleted

## 2016-10-19 DIAGNOSIS — Z3A36 36 weeks gestation of pregnancy: Secondary | ICD-10-CM | POA: Diagnosis not present

## 2016-10-19 DIAGNOSIS — O30043 Twin pregnancy, dichorionic/diamniotic, third trimester: Principal | ICD-10-CM | POA: Insufficient documentation

## 2016-10-19 NOTE — OB Triage Note (Signed)
Scheduled NST for twins 

## 2016-10-19 NOTE — OB Triage Note (Signed)
  L&D OB Triage Note  SUBJECTIVE Tammy Leon is a 31 y.o. G63P1011 female at [redacted]w[redacted]d, EDD Estimated Date of Delivery: 11/13/16 who presented to triage  For NST Di-Di Twins.   Obstetric History   G3   P1   T1   P0   A1   L1    SAB0   TAB1   Ectopic0   Multiple0   Live Births1     # Outcome Date GA Lbr Len/2nd Weight Sex Delivery Anes PTL Lv  3 Current           2 TAB 2015        ND  1 Term 08/27/11 [redacted]w[redacted]d  6 lb 12.8 oz (3.084 kg) M CS-LTranv EPI  LIV     Name: Maiello,BOY [place]     Complications: Fetal Intolerance     Apgar1:  9                Apgar5: 9      Prescriptions Prior to Admission  Medication Sig Dispense Refill Last Dose  . Ascorbic Acid (VITAMIN C WITH ROSE HIPS) 500 MG tablet      . diphenhydrAMINE (SLEEP AID, DIPHENHYDRAMINE,) 25 MG tablet    Taking  . Ferrous Sulfate (IRON) 325 (65 Fe) MG TABS      . prenatal vitamin w/FE, FA (NATACHEW) 29-1 MG CHEW chewable tablet Chew 1 tablet by mouth daily at 12 noon.   Taking     OBJECTIVE  Nursing Evaluation:   Resp (!) 98   LMP 02/07/2016 (Approximate)    Findings:  Reactive NST Twin A , Reactive NST Twin B   NST was performed and has been reviewed by me.  NST INTERPRETATION: Category I   Twin A Baseline: 140 Variability: Moderate Accelerations: Present  Decelerations: Absent  Twin B   Baseline: 145 Variability: moderate  Accelerations: Present Decelerations: Absent  Toco: Absent    ASSESSMENT Impression:  1. Pregnancy:  G3P1011 at [redacted]w[redacted]d ,Di-Di twins.  EDD Estimated Date of Delivery: 11/13/16 2.  reactive  PLAN 1. Reassurance given 2. Discharge home with precautions to return to L&D or call the office if:  contractions more than  6 per  1 hour, decreased fetal movement or bleeding from vaginal area  3. Continue routine prenatal care.  Doreene Burke, CNM

## 2016-10-19 NOTE — Discharge Instructions (Signed)
Drink plenty of fluid and get plenty of rest. Call your provider for any other concerns °

## 2016-10-19 NOTE — Discharge Summary (Signed)
Patient discharged home, discharge instructions given, patient states understanding. Patient left floor in stable condition, denies any other needs at this time. Patient to keep next scheduled OB appointment 

## 2016-10-22 ENCOUNTER — Ambulatory Visit (INDEPENDENT_AMBULATORY_CARE_PROVIDER_SITE_OTHER): Payer: Medicaid Other | Admitting: Certified Nurse Midwife

## 2016-10-22 ENCOUNTER — Other Ambulatory Visit: Payer: Medicaid Other

## 2016-10-22 ENCOUNTER — Ambulatory Visit (INDEPENDENT_AMBULATORY_CARE_PROVIDER_SITE_OTHER): Payer: Medicaid Other

## 2016-10-22 VITALS — BP 120/79 | HR 83 | Wt 198.9 lb

## 2016-10-22 DIAGNOSIS — O30003 Twin pregnancy, unspecified number of placenta and unspecified number of amniotic sacs, third trimester: Secondary | ICD-10-CM

## 2016-10-22 DIAGNOSIS — O30043 Twin pregnancy, dichorionic/diamniotic, third trimester: Secondary | ICD-10-CM

## 2016-10-22 DIAGNOSIS — Z3493 Encounter for supervision of normal pregnancy, unspecified, third trimester: Secondary | ICD-10-CM

## 2016-10-22 LAB — POCT URINALYSIS DIPSTICK
Bilirubin, UA: NEGATIVE
Glucose, UA: NEGATIVE
Ketones, UA: NEGATIVE
Leukocytes, UA: NEGATIVE
NITRITE UA: NEGATIVE
PROTEIN UA: NEGATIVE
RBC UA: NEGATIVE
SPEC GRAV UA: 1.015 (ref 1.010–1.025)
UROBILINOGEN UA: 0.2 U/dL
pH, UA: 6 (ref 5.0–8.0)

## 2016-10-22 NOTE — Patient Instructions (Signed)
Nonstress Test The nonstress test is a procedure that monitors the fetus's heartbeat. The test will monitor the heartbeat when the fetus is at rest and while the fetus is moving. In a healthy fetus, there will be an increase in fetal heart rate when the fetus moves or kicks. The heart rate will decrease at rest. This test helps determine if the fetus is healthy. Your health care provider will look at a number of patterns in the heart rate tracing to make sure your baby is thriving. If there is concern, your health care provider may order additional tests or may suggest another course of action. This test is often done in the third trimester and can help determine if an early delivery is needed and safe. Common reasons to have this test are:  You are past your due date.  You have a high-risk pregnancy.  You are feeling less movement than normal.  You have lost a pregnancy in the past.  Your health care provider suspects fetal growth problems.  You have too much or too little amniotic fluid.  What happens before the procedure?  Eat a meal right before the test or as directed by your health care provider. Food may help stimulate fetal movements.  Use the restroom right before the test. What happens during the procedure?  Two belts will be placed around your abdomen. These belts have monitors attached to them. One records the fetal heart rate and the other records uterine contractions.  You may be asked to lie down on your side or to stay sitting upright.  You may be given a button to press when you feel movement.  The fetal heartbeat is listened to and watched on a screen. The heartbeat is recorded on a sheet of paper.  If the fetus seems to be sleeping, you may be asked to drink some juice or soda, gently press your abdomen, or make some noise to wake the fetus. What happens after the procedure? Your health care provider will discuss the test results with you and make recommendations  for the near future.  This information is not intended to replace advice given to you by your health care provider. Make sure you discuss any questions you have with your health care provider. This information is not intended to replace advice given to you by your health care provider. Make sure you discuss any questions you have with your health care provider. Document Released: 01/30/2002 Document Revised: 01/10/2016 Document Reviewed: 03/15/2012 Elsevier Interactive Patient Education  2018 Elsevier Inc.  

## 2016-10-22 NOTE — Progress Notes (Signed)
ROB-Pt doing well, repeat c-section schedule for Friday, 10/30/2016 with Dr. Valentino Saxonherry. BPPs today 8/8. NST scheduled Monday on birthing suites, then RTC x Tuesday for pre-op visit with Dr. Valentino Saxonherry. Reviewed red flag symptoms and when to call. RTC as scheduled or sooner if needed.   ULTRASOUND REPORT  Location: [facility] Date of Service: 10/22/16  Indications:BPP Twins Findings: BABY A DIDI twin intrauterine pregnancy is visualized with FHR at 132 BPM. Fetal presentation is Vertex materna right Placenta: Anterior, grade 1-2, remote to cervix.. AFI: Adequate with MVP of 437.  Fetal stomach and Bladder are visualized.  Fetal movement, fetal tone and fetal breathing are visualized.  Impression: 1. Fetal BPP Score 8 of 8.   Findings: BABY B DIDI twin intrauterine pregnancy is visualized with FHR at 157 BPM. Fetal presentation is Vertex maternal left. Placenta: Posterior, grade 1-2, remote to cervix. AFI: Adequate with MVP of .  Fetal stomach and Bladder are visualized.  Fetal movement, fetal tone and fetal breathing are visualized.  Impression: 1. Fetal BPP Score 8 of 8.  Recommendations: 1.Clinical correlation with the patient's History and Physical Exam.

## 2016-10-26 ENCOUNTER — Observation Stay
Admission: AD | Admit: 2016-10-26 | Discharge: 2016-10-26 | Disposition: A | Discharge status: Home / Self Care | Payer: Medicaid Other | Source: Intra-hospital | Attending: Obstetrics and Gynecology | Admitting: Obstetrics and Gynecology

## 2016-10-26 DIAGNOSIS — Z79899 Other long term (current) drug therapy: Secondary | ICD-10-CM | POA: Insufficient documentation

## 2016-10-26 DIAGNOSIS — Z3A37 37 weeks gestation of pregnancy: Secondary | ICD-10-CM | POA: Diagnosis not present

## 2016-10-26 DIAGNOSIS — Z3483 Encounter for supervision of other normal pregnancy, third trimester: Secondary | ICD-10-CM | POA: Diagnosis present

## 2016-10-26 DIAGNOSIS — O30043 Twin pregnancy, dichorionic/diamniotic, third trimester: Secondary | ICD-10-CM

## 2016-10-26 DIAGNOSIS — Z3689 Encounter for other specified antenatal screening: Secondary | ICD-10-CM

## 2016-10-26 MED ORDER — ACETAMINOPHEN 325 MG PO TABS: 650.0000 mg | ORAL_TABLET | ORAL | Status: DC | PRN

## 2016-10-26 NOTE — OB Triage Note (Signed)
L&D OB Triage Note  SUBJECTIVE Tammy Leon is a 31 y.o. 663P1011 female at 2520w3d Di-Di twins,  EDD Estimated Date of Delivery: 11/13/16 who presented to triage for scheduled NST.   Obstetric History   G3   P1   T1   P0   A1   L1    SAB0   TAB1   Ectopic0   Multiple0   Live Births1     # Outcome Date GA Lbr Len/2nd Weight Sex Delivery Anes PTL Lv  3 Current           2 TAB 2015        ND  1 Term 08/27/11 [redacted]w[redacted]d  6 lb 12.8 oz (3.084 kg) M CS-LTranv EPI  LIV     Name: Nardone,BOY [place]     Complications: Fetal Intolerance     Apgar1:  9                Apgar5: 9      Prescriptions Prior to Admission  Medication Sig Dispense Refill Last Dose  . Ascorbic Acid (VITAMIN C WITH ROSE HIPS) 500 MG tablet      . diphenhydrAMINE (SLEEP AID, DIPHENHYDRAMINE,) 25 MG tablet    Taking  . Ferrous Sulfate (IRON) 325 (65 Fe) MG TABS      . prenatal vitamin w/FE, FA (NATACHEW) 29-1 MG CHEW chewable tablet Chew 1 tablet by mouth daily at 12 noon.   Taking     OBJECTIVE  Nursing Evaluation:   LMP 02/07/2016 (Approximate)    Findings:  Reactive NST  NST was performed and has been reviewed by me.  NST INTERPRETATION: Category I   Baby A ( Blue) Baseline 135 Variability: Moderate Accelerations: present Decelerations: Absent         Baby B ( Yellow) Baseline 150 Variability: Moderate Accelerations: present Decelerations: Absent    Toco: irritability      ASSESSMENT Impression:  1. Pregnancy:  G3P1011 at 1220w3d , EDD Estimated Date of Delivery: 11/13/16 2.  reactive  PLAN 1. Reassurance given 2. Discharge home with precautions to return to L&D or call the office if:  increased leakage or fluid, contractions more than  10 per  1 hour, decreased fetal movement, persistent low back pain or cramping or bleeding from vaginal area  3. Continue routine prenatal care.  Doreene BurkeAnnie Anh Bigos, CNM

## 2016-10-27 ENCOUNTER — Ambulatory Visit (INDEPENDENT_AMBULATORY_CARE_PROVIDER_SITE_OTHER): Payer: Medicaid Other | Admitting: Obstetrics and Gynecology

## 2016-10-27 VITALS — BP 104/68 | HR 80 | Wt 198.7 lb

## 2016-10-27 DIAGNOSIS — O30043 Twin pregnancy, dichorionic/diamniotic, third trimester: Secondary | ICD-10-CM

## 2016-10-27 DIAGNOSIS — O34219 Maternal care for unspecified type scar from previous cesarean delivery: Secondary | ICD-10-CM

## 2016-10-27 LAB — POCT URINALYSIS DIPSTICK
Bilirubin, UA: NEGATIVE
Glucose, UA: NEGATIVE
KETONES UA: NEGATIVE
Leukocytes, UA: NEGATIVE
Nitrite, UA: NEGATIVE
PROTEIN UA: NEGATIVE
RBC UA: NEGATIVE
SPEC GRAV UA: 1.01 (ref 1.010–1.025)
Urobilinogen, UA: 0.2 E.U./dL
pH, UA: 6.5 (ref 5.0–8.0)

## 2016-10-27 NOTE — Progress Notes (Signed)
ROB: Doing well, no complaints. All final questions answered regarding planned C-section. Patient has pre-op on Thursday at 9 a.m. C-section scheduled for this Friday.

## 2016-10-29 ENCOUNTER — Emergency Department
Admission: EM | Admit: 2016-10-29 | Discharge: 2016-10-29 | Discharge status: Absent without leave | Payer: Medicaid Other

## 2016-10-29 ENCOUNTER — Encounter
Admission: RE | Admit: 2016-10-29 | Discharge: 2016-10-29 | Disposition: A | Discharge status: Home / Self Care | Payer: Medicaid Other | Source: Ambulatory Visit | Attending: Obstetrics and Gynecology | Admitting: Obstetrics and Gynecology

## 2016-10-29 HISTORY — DX: Restless legs syndrome: G25.81

## 2016-10-29 HISTORY — DX: Gastro-esophageal reflux disease without esophagitis: K21.9

## 2016-10-29 LAB — CBC
HEMATOCRIT: 34.4 % — AB (ref 35.0–47.0)
Hemoglobin: 12.1 g/dL (ref 12.0–16.0)
MCH: 31.6 pg (ref 26.0–34.0)
MCHC: 35.1 g/dL (ref 32.0–36.0)
MCV: 90 fL (ref 80.0–100.0)
Platelets: 202 10*3/uL (ref 150–440)
RBC: 3.83 MIL/uL (ref 3.80–5.20)
RDW: 15 % — ABNORMAL HIGH (ref 11.5–14.5)
WBC: 10.8 10*3/uL (ref 3.6–11.0)

## 2016-10-29 LAB — TYPE AND SCREEN
ABO/RH(D): B POS
ANTIBODY SCREEN: NEGATIVE
EXTEND SAMPLE REASON: UNDETERMINED

## 2016-10-29 LAB — RAPID HIV SCREEN (HIV 1/2 AB+AG)
HIV 1/2 Antibodies: NONREACTIVE
HIV-1 P24 Antigen - HIV24: NONREACTIVE

## 2016-10-29 MED ORDER — CEFAZOLIN SODIUM-DEXTROSE 2-4 GM/100ML-% IV SOLN
2.0000 g | INTRAVENOUS | Status: AC
  Administered 2016-10-30: 2 g via INTRAVENOUS
  Filled 2016-10-29: qty 100

## 2016-10-29 NOTE — Patient Instructions (Signed)
Your procedure is scheduled on: October 30, 2016 (FRIDAY ) Report to EMERGENCY DEPARTMENT ARRIVAL TIME 5:30 AM    Remember: Instructions that are not followed completely may result in serious medical risk, up to and including death, or upon the discretion of your surgeon and anesthesiologist your surgery may need to be rescheduled.    _x___ 1. Do not eat food after midnight the night before your procedure. You may drink clear liquids up to 2 hours before you are scheduled to arrive at the hospital for your procedure.  Do not drink clear liquids within 2 hours of your scheduled arrival to the hospital.  Clear liquids include  --Water or Apple juice without pulp  --Clear carbohydrate beverage such as ClearFast or Gatorade  --Black Coffee or Clear Tea (No milk, no creamers, do not add anything to                  the coffee or Tea Type 1 and type 2 diabetics should only drink water.  No gum chewing or hard candies.     __x__ 2. No Alcohol for 24 hours before or after surgery.   __x__3. No Smoking for 24 prior to surgery.   ____  4. Bring all medications with you on the day of surgery if instructed.    __x__ 5. Notify your doctor if there is any change in your medical condition     (cold, fever, infections).     Do not wear jewelry, make-up, hairpins, clips or nail polish.  Do not wear lotions, powders, or perfumes.  Do not shave 48 hours prior to surgery. Men may shave face and neck.  Do not bring valuables to the hospital.    [facility] is not responsible for any belongings or valuables.               Contacts, dentures or bridgework may not be worn into surgery.  Leave your suitcase in the car. After surgery it may be brought to your room.  For patients admitted to the hospital, discharge time is determined by your  treatment team                    .   Patients discharged the day of surgery will not be allowed to drive home.  You will need someone to drive you home and stay  with you the night of your procedure.    Please read over the following fact sheets that you were given:   [facility] Preparing for Surgery and or MRSA Information   ___ Take anti-hypertensive listed below, cardiac, seizure, asthma,     anti-reflux and psychiatric medicines. These include:  1.   2.  3.  4.  5.  6.  ____Fleets enema or Magnesium Citrate as directed.   _x___ Use CHG Soap or sage wipes as directed on instruction sheet   ____ Use inhalers on the day of surgery and bring to hospital day of surgery  ____ Stop Metformin and Janumet 2 days prior to surgery.    ____ Take 1/2 of usual insulin dose the night before surgery and none on the morning surgery      _x___ Follow recommendations from Cardiologist, Pulmonologist or PCP regarding          stopping Aspirin, Coumadin, Plavix ,Eliquis, Effient, or Pradaxa, and Pletal.  X____Stop Anti-inflammatories such as Advil, Aleve, Ibuprofen, Motrin, Naproxen, Naprosyn, Goodies powders or aspirin products. OK to take Tylenol    _x___  Stop supplements until after surgery.  But may continue Vitamin D, Vitamin B, and multivitamin         ____ Bring C-Pap to the hospital.

## 2016-10-30 ENCOUNTER — Encounter
Admission: RE | Disposition: A | Discharge status: Home / Self Care | Payer: Self-pay | Source: Ambulatory Visit | Attending: Obstetrics and Gynecology

## 2016-10-30 ENCOUNTER — Inpatient Hospital Stay: Payer: Medicaid Other | Admitting: Anesthesiology

## 2016-10-30 ENCOUNTER — Inpatient Hospital Stay
Admission: RE | Admit: 2016-10-30 | Discharge: 2016-11-01 | DRG: 765 | Disposition: A | Discharge status: Home / Self Care | Payer: Medicaid Other | Source: Ambulatory Visit | Attending: Obstetrics and Gynecology | Admitting: Obstetrics and Gynecology

## 2016-10-30 DIAGNOSIS — K219 Gastro-esophageal reflux disease without esophagitis: Secondary | ICD-10-CM | POA: Diagnosis present

## 2016-10-30 DIAGNOSIS — O99324 Drug use complicating childbirth: Secondary | ICD-10-CM | POA: Diagnosis present

## 2016-10-30 DIAGNOSIS — O34211 Maternal care for low transverse scar from previous cesarean delivery: Secondary | ICD-10-CM | POA: Diagnosis present

## 2016-10-30 DIAGNOSIS — O9962 Diseases of the digestive system complicating childbirth: Secondary | ICD-10-CM | POA: Diagnosis present

## 2016-10-30 DIAGNOSIS — F17210 Nicotine dependence, cigarettes, uncomplicated: Secondary | ICD-10-CM | POA: Diagnosis present

## 2016-10-30 DIAGNOSIS — O30049 Twin pregnancy, dichorionic/diamniotic, unspecified trimester: Secondary | ICD-10-CM | POA: Diagnosis present

## 2016-10-30 DIAGNOSIS — Z3A38 38 weeks gestation of pregnancy: Secondary | ICD-10-CM

## 2016-10-30 DIAGNOSIS — Z98891 History of uterine scar from previous surgery: Secondary | ICD-10-CM

## 2016-10-30 DIAGNOSIS — F1290 Cannabis use, unspecified, uncomplicated: Secondary | ICD-10-CM | POA: Diagnosis present

## 2016-10-30 DIAGNOSIS — O99334 Smoking (tobacco) complicating childbirth: Secondary | ICD-10-CM | POA: Diagnosis present

## 2016-10-30 DIAGNOSIS — Z302 Encounter for sterilization: Secondary | ICD-10-CM | POA: Diagnosis not present

## 2016-10-30 DIAGNOSIS — O30043 Twin pregnancy, dichorionic/diamniotic, third trimester: Secondary | ICD-10-CM | POA: Diagnosis present

## 2016-10-30 DIAGNOSIS — O34219 Maternal care for unspecified type scar from previous cesarean delivery: Secondary | ICD-10-CM | POA: Diagnosis present

## 2016-10-30 LAB — ABO/RH: ABO/RH(D): B POS

## 2016-10-30 LAB — RPR: RPR Ser Ql: NONREACTIVE

## 2016-10-30 SURGERY — Surgical Case
Anesthesia: Spinal | Wound class: Clean Contaminated

## 2016-10-30 MED ORDER — DIPHENHYDRAMINE HCL 25 MG PO CAPS: 25.0000 mg | ORAL_CAPSULE | Freq: Four times a day (QID) | ORAL | Status: DC | PRN

## 2016-10-30 MED ORDER — FAMOTIDINE 20 MG PO TABS
10.0000 mg | ORAL_TABLET | Freq: Two times a day (BID) | ORAL | Status: DC
Start: 2016-10-30 — End: 2016-11-01
  Administered 2016-10-30 – 2016-11-01 (×5): 10 mg via ORAL
  Filled 2016-10-30 (×5): qty 1

## 2016-10-30 MED ORDER — COCONUT OIL OIL
1.0000 "application " | TOPICAL_OIL | Status: DC | PRN
  Administered 2016-10-31: 1 via TOPICAL

## 2016-10-30 MED ORDER — PROPOFOL 10 MG/ML IV BOLUS
INTRAVENOUS | Status: AC
  Filled 2016-10-30: qty 20

## 2016-10-30 MED ORDER — IBUPROFEN 800 MG PO TABS
800.0000 mg | ORAL_TABLET | Freq: Four times a day (QID) | ORAL | Status: DC
  Administered 2016-10-30 – 2016-11-01 (×9): 800 mg via ORAL
  Filled 2016-10-30 (×9): qty 1

## 2016-10-30 MED ORDER — OXYTOCIN 40 UNITS IN LACTATED RINGERS INFUSION - SIMPLE MED
2.5000 [IU]/h | INTRAVENOUS | Status: AC
  Filled 2016-10-30: qty 1000

## 2016-10-30 MED ORDER — OXYTOCIN 40 UNITS IN LACTATED RINGERS INFUSION - SIMPLE MED
INTRAVENOUS | Status: AC
  Filled 2016-10-30: qty 1000

## 2016-10-30 MED ORDER — EPHEDRINE SULFATE 50 MG/ML IJ SOLN
INTRAMUSCULAR | Status: AC
  Filled 2016-10-30: qty 1

## 2016-10-30 MED ORDER — SIMETHICONE 80 MG PO CHEW
80.0000 mg | CHEWABLE_TABLET | ORAL | Status: DC | PRN
  Administered 2016-10-31: 80 mg via ORAL
  Filled 2016-10-30: qty 1

## 2016-10-30 MED ORDER — LIDOCAINE 5 % EX PTCH
MEDICATED_PATCH | CUTANEOUS | Status: AC
  Filled 2016-10-30: qty 1

## 2016-10-30 MED ORDER — OXYCODONE-ACETAMINOPHEN 5-325 MG PO TABS
2.0000 | ORAL_TABLET | ORAL | Status: DC | PRN
  Administered 2016-10-30 – 2016-11-01 (×10): 2 via ORAL
  Filled 2016-10-30 (×10): qty 2

## 2016-10-30 MED ORDER — BUPIVACAINE IN DEXTROSE 0.75-8.25 % IT SOLN
INTRATHECAL | Status: DC | PRN
  Administered 2016-10-30 (×2): 1.8 mL via INTRATHECAL

## 2016-10-30 MED ORDER — LIDOCAINE 5 % EX PTCH
1.0000 | MEDICATED_PATCH | CUTANEOUS | Status: DC
  Administered 2016-10-31 – 2016-11-01 (×2): 1 via TRANSDERMAL
  Filled 2016-10-30 (×2): qty 1

## 2016-10-30 MED ORDER — OXYTOCIN 40 UNITS IN LACTATED RINGERS INFUSION - SIMPLE MED
INTRAVENOUS | Status: DC | PRN
  Administered 2016-10-30: 400 mL via INTRAVENOUS

## 2016-10-30 MED ORDER — ACETAMINOPHEN 325 MG PO TABS: 650.0000 mg | ORAL_TABLET | ORAL | Status: DC | PRN

## 2016-10-30 MED ORDER — DIBUCAINE 1 % RE OINT: 1.0000 "application " | TOPICAL_OINTMENT | RECTAL | Status: DC | PRN

## 2016-10-30 MED ORDER — GUAIFENESIN-DM 100-10 MG/5ML PO SYRP
5.0000 mL | ORAL_SOLUTION | ORAL | Status: DC | PRN
  Administered 2016-10-30: 5 mL via ORAL
  Filled 2016-10-30 (×3): qty 5

## 2016-10-30 MED ORDER — SIMETHICONE 80 MG PO CHEW
80.0000 mg | CHEWABLE_TABLET | ORAL | Status: DC
  Administered 2016-10-31 – 2016-11-01 (×2): 80 mg via ORAL
  Filled 2016-10-30 (×2): qty 1

## 2016-10-30 MED ORDER — OXYCODONE HCL 5 MG/5ML PO SOLN: 5.0000 mg | Freq: Once | ORAL | Status: DC | PRN

## 2016-10-30 MED ORDER — FENTANYL CITRATE (PF) 100 MCG/2ML IJ SOLN
25.0000 ug | INTRAMUSCULAR | Status: DC | PRN
Start: 2016-10-30 — End: 2016-10-30

## 2016-10-30 MED ORDER — HYDROMORPHONE HCL 1 MG/ML IJ SOLN: 1.0000 mg | INTRAMUSCULAR | Status: DC | PRN

## 2016-10-30 MED ORDER — LACTATED RINGERS IV SOLN
INTRAVENOUS | Status: DC
  Administered 2016-10-30 (×3): via INTRAVENOUS

## 2016-10-30 MED ORDER — SODIUM CHLORIDE 0.9 % IV SOLN
INTRAVENOUS | Status: DC | PRN
  Administered 2016-10-30: 50 ug/min via INTRAVENOUS

## 2016-10-30 MED ORDER — LACTATED RINGERS IV SOLN
Freq: Once | INTRAVENOUS | Status: AC
  Administered 2016-10-30: 06:00:00 via INTRAVENOUS

## 2016-10-30 MED ORDER — OXYCODONE-ACETAMINOPHEN 5-325 MG PO TABS
1.0000 | ORAL_TABLET | ORAL | Status: DC | PRN
  Administered 2016-11-01: 1 via ORAL
  Filled 2016-10-30: qty 1

## 2016-10-30 MED ORDER — MENTHOL 3 MG MT LOZG
1.0000 | LOZENGE | OROMUCOSAL | Status: DC | PRN
  Administered 2016-10-30: 3 mg via ORAL
  Filled 2016-10-30: qty 9

## 2016-10-30 MED ORDER — ONDANSETRON HCL 4 MG/2ML IJ SOLN
INTRAMUSCULAR | Status: DC | PRN
  Administered 2016-10-30: 8 mg via INTRAVENOUS

## 2016-10-30 MED ORDER — PRENATAL MULTIVITAMIN CH
1.0000 | ORAL_TABLET | Freq: Every day | ORAL | Status: DC
  Administered 2016-10-30 – 2016-11-01 (×3): 1 via ORAL
  Filled 2016-10-30 (×3): qty 1

## 2016-10-30 MED ORDER — SENNOSIDES-DOCUSATE SODIUM 8.6-50 MG PO TABS
2.0000 | ORAL_TABLET | ORAL | Status: DC
  Administered 2016-10-31 – 2016-11-01 (×2): 2 via ORAL
  Filled 2016-10-30 (×2): qty 2

## 2016-10-30 MED ORDER — FENTANYL CITRATE (PF) 100 MCG/2ML IJ SOLN
INTRAMUSCULAR | Status: DC | PRN
  Administered 2016-10-30 (×2): 50 ug via INTRAVENOUS

## 2016-10-30 MED ORDER — PHENYLEPHRINE HCL 10 MG/ML IJ SOLN
INTRAMUSCULAR | Status: AC
  Filled 2016-10-30: qty 1

## 2016-10-30 MED ORDER — FENTANYL CITRATE (PF) 100 MCG/2ML IJ SOLN
INTRAMUSCULAR | Status: AC
  Filled 2016-10-30: qty 2

## 2016-10-30 MED ORDER — FERROUS SULFATE 325 (65 FE) MG PO TABS
325.0000 mg | ORAL_TABLET | Freq: Two times a day (BID) | ORAL | Status: DC
  Administered 2016-10-30 – 2016-11-01 (×4): 325 mg via ORAL
  Filled 2016-10-30 (×4): qty 1

## 2016-10-30 MED ORDER — ONDANSETRON HCL 4 MG/2ML IJ SOLN
INTRAMUSCULAR | Status: AC
  Filled 2016-10-30: qty 4

## 2016-10-30 MED ORDER — MEASLES, MUMPS & RUBELLA VAC ~~LOC~~ INJ
0.5000 mL | INJECTION | Freq: Once | SUBCUTANEOUS | Status: DC
  Filled 2016-10-30: qty 0.5

## 2016-10-30 MED ORDER — WITCH HAZEL-GLYCERIN EX PADS: 1.0000 "application " | MEDICATED_PAD | CUTANEOUS | Status: DC | PRN

## 2016-10-30 MED ORDER — LACTATED RINGERS IV SOLN
INTRAVENOUS | Status: DC
  Administered 2016-10-30 (×2): via INTRAVENOUS

## 2016-10-30 MED ORDER — MAGNESIUM HYDROXIDE 400 MG/5ML PO SUSP: 30.0000 mL | ORAL | Status: DC | PRN

## 2016-10-30 MED ORDER — SUCCINYLCHOLINE CHLORIDE 20 MG/ML IJ SOLN
INTRAMUSCULAR | Status: AC
  Filled 2016-10-30: qty 1

## 2016-10-30 MED ORDER — ZOLPIDEM TARTRATE 5 MG PO TABS: 5.0000 mg | ORAL_TABLET | Freq: Every evening | ORAL | Status: DC | PRN

## 2016-10-30 MED ORDER — SOD CITRATE-CITRIC ACID 500-334 MG/5ML PO SOLN
30.0000 mL | Freq: Once | ORAL | Status: AC
  Administered 2016-10-30: 30 mL via ORAL
  Filled 2016-10-30: qty 15

## 2016-10-30 MED ORDER — OXYCODONE HCL 5 MG PO TABS: 5.0000 mg | ORAL_TABLET | Freq: Once | ORAL | Status: DC | PRN

## 2016-10-30 SURGICAL SUPPLY — 23 items
BAG COUNTER SPONGE EZ (MISCELLANEOUS) ×3 IMPLANT
CANISTER SUCT 3000ML PPV (MISCELLANEOUS) ×4 IMPLANT
CHLORAPREP W/TINT 26ML (MISCELLANEOUS) ×8 IMPLANT
COUNTER SPONGE BAG EZ (MISCELLANEOUS) ×1
DRSG TELFA 3X8 NADH (GAUZE/BANDAGES/DRESSINGS) ×4 IMPLANT
ELECT REM PT RETURN 9FT ADLT (ELECTROSURGICAL) ×4
ELECTRODE REM PT RTRN 9FT ADLT (ELECTROSURGICAL) ×2 IMPLANT
GAUZE SPONGE 4X4 12PLY STRL (GAUZE/BANDAGES/DRESSINGS) ×4 IMPLANT
GLOVE BIO SURGEON STRL SZ 6.5 (GLOVE) ×3 IMPLANT
GLOVE BIO SURGEONS STRL SZ 6.5 (GLOVE) ×1
GLOVE INDICATOR 7.0 STRL GRN (GLOVE) ×4 IMPLANT
GOWN STRL REUS W/ TWL LRG LVL3 (GOWN DISPOSABLE) ×4 IMPLANT
GOWN STRL REUS W/TWL LRG LVL3 (GOWN DISPOSABLE) ×4
KIT RM TURNOVER STRD PROC AR (KITS) ×4 IMPLANT
NS IRRIG 1000ML POUR BTL (IV SOLUTION) ×4 IMPLANT
PACK C SECTION AR (MISCELLANEOUS) ×4 IMPLANT
PAD OB MATERNITY 4.3X12.25 (PERSONAL CARE ITEMS) ×4 IMPLANT
PAD PREP 24X41 OB/GYN DISP (PERSONAL CARE ITEMS) ×4 IMPLANT
SUT MNCRL AB 4-0 PS2 18 (SUTURE) ×4 IMPLANT
SUT PLAIN 2 0 XLH (SUTURE) IMPLANT
SUT VIC AB 0 CT1 36 (SUTURE) ×16 IMPLANT
SUT VIC AB 3-0 SH 27 (SUTURE) ×2
SUT VIC AB 3-0 SH 27X BRD (SUTURE) ×2 IMPLANT

## 2016-10-30 NOTE — Anesthesia Preprocedure Evaluation (Signed)
Anesthesia Evaluation  Patient identified by MRN, date of birth, ID band Patient awake    Reviewed: Allergy & Precautions, H&P , NPO status , Patient's Chart, lab work & pertinent test results  History of Anesthesia Complications Negative for: history of anesthetic complications  Airway Mallampati: III  TM Distance: >3 FB Neck ROM: full    Dental  (+) Chipped, Poor Dentition   Pulmonary neg shortness of breath, Current Smoker,           Cardiovascular Exercise Tolerance: Good (-) hypertension(-) angina(-) Past MI negative cardio ROS       Neuro/Psych    GI/Hepatic GERD  Medicated and Controlled,  Endo/Other    Renal/GU   negative genitourinary   Musculoskeletal   Abdominal   Peds  Hematology negative hematology ROS (+)   Anesthesia Other Findings Past Medical History: No date: GERD (gastroesophageal reflux disease) No date: Restless leg syndrome  Past Surgical History: 08/27/2011: CESAREAN SECTION     Comment:  Procedure: CESAREAN SECTION;  Surgeon: Oliver PilaKathy W               Richardson, MD;  Location: [facility];  Service: Gynecology;               Laterality: N/A;  BMI    Body Mass Index:  36.21 kg/m      Reproductive/Obstetrics (+) Pregnancy                             Anesthesia Physical Anesthesia Plan  ASA: III  Anesthesia Plan: Spinal   Post-op Pain Management:    Induction:   PONV Risk Score and Plan:   Airway Management Planned: Natural Airway and Nasal Cannula  Additional Equipment:   Intra-op Plan:   Post-operative Plan:   Informed Consent: I have reviewed the patients History and Physical, chart, labs and discussed the procedure including the risks, benefits and alternatives for the proposed anesthesia with the patient or authorized representative who has indicated his/her understanding and acceptance.   Dental Advisory Given  Plan Discussed with:  Anesthesiologist, CRNA and Surgeon  Anesthesia Plan Comments: (Patient reports no bleeding problems and no anticoagulant use.  Plan for spinal with backup GA  Patient consented for risks of anesthesia including but not limited to:  - adverse reactions to medications - risk of bleeding, infection, nerve damage and headache - risk of failed spinal - damage to teeth, lips or other oral mucosa - sore throat or hoarseness - Damage to heart, brain, lungs or loss of life  Patient voiced understanding.)        Anesthesia Quick Evaluation

## 2016-10-30 NOTE — Op Note (Signed)
Cesarean Section Procedure Note  Indications: patient declines vag del attempt and previous uterine incision (low transverse x 1)  Pre-operative Diagnosis: 1) 38 week 0 day pregnancy, diamniotic dizygotic twin pregnancy, 2) prior C-section x 1 declining TOLAC, 3) multiparity desiring permanent sterilization.  Post-operative Diagnosis: Same  Surgeon: Hildred LaserAnika Malgorzata Albert, MD  Assistants: Serafina RoyalsMichelle Lawhorn, CNM  Procedure: Repeat low transverse Cesarean Section with bilateral tubal ligation  Anesthesia: Spinal anesthesia  Procedure Details: The patient was seen in the Holding Room. The risks, benefits, complications, treatment options, and expected outcomes were discussed with the patient.  The patient concurred with the proposed plan, giving informed consent.  The site of surgery properly noted/marked. The patient was taken to the Operating Room, identified as San JettyStephanie L Pau and the procedure verified as C-Section Delivery. A Time Out was held and the above information confirmed.  After induction of anesthesia, the patient was draped and prepped in the usual sterile manner. Anesthesia was tested and noted to be adequate. A Pfannenstiel incision was made and carried down through the subcutaneous tissue to the fascia. Fascial incision was made and extended transversely. The fascia was separated from the underlying rectus tissue superiorly and inferiorly. The peritoneum was identified and entered. Peritoneal incision was extended longitudinally. The utero-vesical peritoneal reflection was incised transversely and the bladder flap was bluntly freed from the lower uterine segment. A low transverse uterine incision was made. The first amniotic sac was ruptured, with clear fluid noted. Delivered from cephalic presentation was a 2605 gram Female with Apgar scores of 8 at one minute and 9 at five minutes.  The umbilical cord was clamped and cut.  The second amniotic sac was ruptured, with clear fluid noted.  Delivered from cephalic presentation was a 2690 gram Female with Apgar scores of 8 at one minute and 9 at five minutes. The umbilical cord was clamped and cut.  No cord blood was obtained for evaluation. The placenta was removed intact and appeared normal. The umbilical cord of Baby B was tagged using a cord clamp for separate identification. The uterus was exteriorized and cleared of all clots and debris. The uterine outline, tubes and ovaries appeared normal.  The uterine incision was closed with a running locked suture of 0-Vicryl.  A figure-of-eight stitch was placed at the midline of the incision to achieve hemostasis.   Hemostasis was observed.   Attention was then turned to the fallopian tubes, and where the patient's right fallopian tube was identified and grasped with a Babcock clamp.  The tube was then followed out to the vimbria.  The Babcock clamp was then used to grasp the tube approximately 4 cm from the cornual region.  A 3 cm segment of tube was then ligated with a free tie of 0-Chromic using the [place] method and excised.  The left fallopian tube was then ligated in a similar fashion and excised. The tubal lumens were cauterized bilaterally.  Good hemostasis was noted with bilateral fallopian tubes. The uterus was then returned to the abdomen.   Lavage was carried out until clear. The fascia was then reapproximated with a running suture of 0-Vicryl. The subcutaneous fat layer was reapproximated with 3-0 Vicryl. The skin was reapproximated with 4-0 Monocryl.  Dermabond was placed over the incision.  A Lidoderm patch was placed above and below the incision to aid with post-operative analgesia.   Instrument, sponge, and needle counts were correct prior the abdominal closure and at the conclusion of the case.   Findings: Female infant (  twin A), cephalic presentation, 2605 grams, with Apgar scores of 8 at one minute and 9 at five minutes. Female infant (twin B), cephalic presentation, 2690 grams,  with Apgar scores of 8 at one minute and 9 at five minutes. Clear amniotic fluid at rupture x 2. Intact placenta x 2 with 3 vessel cord.  The uterine outline, tubes and ovaries appeared normal.   Estimated Blood Loss:  500 ml      Drains: foley catheter to gravity drainage, 300 clear urine at end of the procedure         Total IV Fluids:  1900 ml  Specimens: Segments of bilateral fallopian tubes          Implants: None         Complications:  None; patient tolerated the procedure well.         Disposition: PACU - hemodynamically stable.         Condition: stable   Hildred Laser, MD [facility]

## 2016-10-30 NOTE — Anesthesia Procedure Notes (Addendum)
Spinal  Patient location during procedure: OR Start time: 10/30/2016 8:00 AM End time: 10/30/2016 8:12 AM Staffing Anesthesiologist: Katy Fitch K Performed: anesthesiologist  Preanesthetic Checklist Completed: patient identified, site marked, surgical consent, pre-op evaluation, timeout performed, IV checked, risks and benefits discussed and monitors and equipment checked Spinal Block Patient position: sitting Prep: ChloraPrep Patient monitoring: heart rate, continuous pulse ox, blood pressure and cardiac monitor Approach: midline Location: L2-3 Injection technique: single-shot Needle Needle type: Whitacre and Introducer  Needle gauge: 24 G Needle length: 9 cm Assessment Sensory level: T10 Additional Notes CSF return with first spinal but no level after first spinal(L3/L4) so spinal was repeated 1 level higher Negative paresthesia. Negative blood return. Positive free-flowing CSF. Expiration date of kit checked and confirmed. Patient tolerated procedure well, without complications.

## 2016-10-30 NOTE — H&P (Addendum)
Obstetric Preoperative History and Physical  Tammy Leon is a 30 y.o. [phone] with twin di-di IUP at [redacted]w[redacted]d presenting for presenting for scheduled repeat cesarean section for h/o prior C-section x 1, declining TOLAC with bilateral tubal ligation.  No acute concerns.   Prenatal Course Source of Care: [facility] with onset of care at 10 weeks Pregnancy complications or risks: Patient Active Problem List   Diagnosis Date Noted  . Labor and delivery, indication for care 10/19/2016  . Twin pregnancy, twins dichorionic and diamniotic 06/19/2016  . History of cesarean delivery, currently pregnant 06/19/2016  . History of marijuana use 06/19/2016   She plans to breastfeed She desires IUD for postpartum contraception.   Prenatal labs and studies: ABO, Rh: --/--/B POS (09/06 1014) Antibody: NEG (09/06 1014) Rubella: 2.42 (02/28 1544) RPR: Non Reactive (09/06 1014)  HBsAg: Negative (02/28 1544)  HIV:  Negative (09/06) AVW:[id number] (08/23 1208) 1 hr Glucola normal (121) Genetic screening not performed Anatomy US normal x 2     OB History  Gravida Para Term Preterm AB Living  SAB TAB Ectopic Multiple Live Births    1     1    # Outcome Date GA Lbr Len/2nd Weight Sex Delivery Anes PTL Lv  3 Current           2 TAB 2015        ND  1 Term 08/27/11 [redacted]w[redacted]d  6 lb 12.8 oz (3.084 kg) M CS-LTranv EPI  LIV     Complications: Fetal Intolerance      Past Medical History:  Diagnosis Date  . GERD (gastroesophageal reflux disease)   . Restless leg syndrome     Family History  Problem Relation Age of Onset  . Diabetes Father   . Asthma Father   . Diabetes Maternal Grandmother   . Cancer Paternal Grandmother        breast  . Diabetes Paternal Grandmother   . Stroke Paternal Grandmother   . Hypertension Paternal Grandfather   . Diabetes Paternal Grandfather   . Other Neg Hx     Past Surgical History:  Procedure Laterality Date  . CESAREAN SECTION   08/27/2011   Procedure: CESAREAN SECTION;  Surgeon: Oliver Pila, MD;  Location: [facility];  Service: Gynecology;  Laterality: N/A;    Social History   Social History  . Marital status: Married    Spouse name: N/A  . Number of children: N/A  . Years of education: N/A   Social History Main Topics  . Smoking status: Current Some Day Smoker    Packs/day: 0.20    Types: Cigarettes  . Smokeless tobacco: Never Used     Comment: trying to quit, decreasing cigarrettes  . Alcohol use No     Comment: before pregnancy  . Drug use: Yes    Types: Marijuana     Comment: December 2017 last time  . Sexual activity: Yes    Partners: Male    Birth control/ protection: None   Other Topics Concern  . Not on file   Social History Narrative  . No narrative on file     Prescriptions Prior to Admission  Medication Sig Dispense Refill Last Dose  . acetaminophen (TYLENOL) 500 MG tablet Take 1,000 mg by mouth every 8 (eight) hours as needed for mild pain or headache.     . Ascorbic Acid (VITAMIN C PO) Take 1 tablet by mouth daily.     Marland Kitchen  diphenhydrAMINE (SLEEP AID, DIPHENHYDRAMINE,) 25 MG tablet Take 25 mg by mouth at bedtime as needed for sleep.    Taking  . Ferrous Sulfate (IRON) 325 (65 Fe) MG TABS Take 325 mg by mouth daily.    Taking  . prenatal vitamin w/FE, FA (NATACHEW) 29-1 MG CHEW chewable tablet Chew 1 tablet by mouth daily at 12 noon.   Taking  . ranitidine (ZANTAC 150 MAXIMUM STRENGTH) 150 MG tablet Take 150 mg by mouth 2 (two) times daily as needed for heartburn (takes 1 time daily in the morning and 2nd dose only if needed).    Taking    No Known Allergies  Review of Systems: Negative except for what is mentioned in HPI.  Physical Exam: BP 101/87 (BP Location: Left Arm)   Pulse 93   Temp 98.2 F (36.8 C) (Oral)   Resp 18   Ht  (1.575 m)   Wt 198 lb (89.8 kg)   LMP 02/07/2016 (Approximate)   BMI 36.21 kg/m  FHR by Doppler: Twin A 144 bpm, Twin B 151 bpm GENERAL:  Well-developed, well-nourished female in no acute distress.  LUNGS: Clear to auscultation bilaterally.  HEART: Regular rate and rhythm. ABDOMEN: Soft, nontender, nondistended, gravid, well-healed Pfannenstiel incision. PELVIC: Deferred EXTREMITIES: Nontender, no edema, 2+ distal pulses.   Pertinent Labs/Studies:   Results for orders placed or performed during the hospital encounter of 10/29/16 (from the past 72 hour(s))  CBC     Status: Abnormal   Collection Time: 10/29/16 10:14 AM  Result Value Ref Range   WBC 10.8 3.6 - 11.0 K/uL   RBC 3.83 3.80 - 5.20 MIL/uL   Hemoglobin 12.1 12.0 - 16.0 g/dL   HCT 40.9 (L) 81.1 - 91.4 %   MCV 90.0 80.0 - 100.0 fL   MCH 31.6 26.0 - 34.0 pg   MCHC 35.1 32.0 - 36.0 g/dL   RDW 78.2 (H) 95.6 - 21.3 %   Platelets 202 150 - 440 K/uL  Rapid HIV screen (HIV 1/2 Ab+Ag)     Status: None   Collection Time: 10/29/16 10:14 AM  Result Value Ref Range   HIV-1 P24 Antigen - HIV24 NON REACTIVE NON REACTIVE   HIV 1/2 Antibodies NON REACTIVE NON REACTIVE   Interpretation (HIV Ag Ab)      A non reactive test result means that HIV 1 or HIV 2 antibodies and HIV 1 p24 antigen were not detected in the specimen.  RPR     Status: None   Collection Time: 10/29/16 10:14 AM  Result Value Ref Range   RPR Ser Ql Non Reactive Non Reactive    Comment: (NOTE) Performed At: [facility] [address] [place], [place] [id number] Mila Homer MD GE:[id number]   Type and screen     Status: None   Collection Time: 10/29/16 10:14 AM  Result Value Ref Range   ABO/RH(D) B POS    Antibody Screen NEG    Sample Expiration 11/01/2016    Extend sample reason PREGNANT WITHIN 3 MONTHS, UNABLE TO EXTEND     Assessment and Plan :Tammy Leon is a 31 y.o. G3P1011 at [redacted]w[redacted]d being admitted  for scheduled repeat cesarean section delivery with bilateral tubal ligation. The patient is understanding of the planned procedure and is aware of and accepting of all surgical risks,  including but not limited to: bleeding which may require transfusion or reoperation; infection which may require antibiotics; injury to bowel, bladder, ureters or other surrounding organs which may  require repair; injury to the fetus; need for additional procedures including hysterectomy in the event of life-threatening complications; placental abnormalities wth subsequent pregnancies; incisional problems; blood clot disorders which may require blood thinners;, and other postoperative/anesthesia complications. The patient is in agreement with the proposed plan, and gives informed written consent for the procedure. All questions have been answered.   Patient also desires permanent sterilization.  Other reversible forms of contraception were discussed with patient; she declines all other modalities. Risks of procedure discussed with patient including but not limited to: risk of regret, permanence of method, bleeding, infection, injury to surrounding organs and need for additional procedures.  Failure risk of 1-2 % with increased risk of ectopic gestation if pregnancy occurs was also discussed with patient.  Patient verbalized understanding of these risks and wants to proceed with sterilization.  Written informed consent obtained.  To OR when ready.   Hildred Laserherry, Zackaria Burkey, MD [facility]

## 2016-10-30 NOTE — Transfer of Care (Signed)
Immediate Anesthesia Transfer of Care Note  Patient: Tammy Leon  Procedure(s) Performed: Procedure(s): REPEAT CESAREAN SECTION MULTI-GESTATIONAL WITH TUBAL  Patient Location: Mother/Baby  Anesthesia Type:Spinal  Level of Consciousness: awake, alert , oriented and patient cooperative  Airway & Oxygen Therapy: Patient Spontanous Breathing  Post-op Assessment: Report given to RN and Post -op Vital signs reviewed and stable  Post vital signs: Reviewed and stable  Last Vitals:  Vitals:   10/30/16 0708 10/30/16 0943  BP: 114/64 (!) 103/50  Pulse: 91   Resp: 16 17  Temp: 36.6 C   SpO2:  100%    Last Pain:  Vitals:   10/30/16 0943  TempSrc:   PainSc: 0-No pain         Complications: No apparent anesthesia complications

## 2016-10-30 NOTE — Anesthesia Post-op Follow-up Note (Signed)
Anesthesia QCDR form completed.        

## 2016-10-31 DIAGNOSIS — Z302 Encounter for sterilization: Secondary | ICD-10-CM

## 2016-10-31 LAB — CBC
HEMATOCRIT: 30.7 % — AB (ref 35.0–47.0)
HEMOGLOBIN: 10.6 g/dL — AB (ref 12.0–16.0)
MCH: 31.9 pg (ref 26.0–34.0)
MCHC: 34.6 g/dL (ref 32.0–36.0)
MCV: 92.2 fL (ref 80.0–100.0)
Platelets: 183 10*3/uL (ref 150–440)
RBC: 3.33 MIL/uL — AB (ref 3.80–5.20)
RDW: 15.1 % — ABNORMAL HIGH (ref 11.5–14.5)
WBC: 11.4 10*3/uL — ABNORMAL HIGH (ref 3.6–11.0)

## 2016-10-31 NOTE — Progress Notes (Signed)
Subjective: Post Op Day 1 Day Post-Op: Cesarean Delivery Patient reports incisional pain, tolerating PO, + flatus, no problems voiding, ambulating and Breastfeeding.    Objective: Vital signs in last 24 hours: Temp:  [97.6 F (36.4 C)-98.1 F (36.7 C)] 97.6 F (36.4 C) (09/08 0258) Pulse Rate:  [63-71] 63 (09/08 0820) Resp:  [13-18] 18 (09/08 0820) BP: (102-123)/(54-79) 115/74 (09/08 0820) SpO2:  [98 %-100 %] 99 % (09/08 0820)  Physical Exam:  General: alert, cooperative and appears stated age Lochia: appropriate Uterine Fundus: firm Incision: healing well, no significant drainage DVT Evaluation: No evidence of DVT seen on physical exam. Negative Homan's sign.   Recent Labs  10/29/16 1014 10/31/16 0459  HGB 12.1 10.6*  HCT 34.4* 30.7*     Assessment/Plan: Status post Cesarean section. Doing well postoperatively.  Continue current care.  Melody N Shambley 10/31/2016, 11:25 AM

## 2016-10-31 NOTE — Progress Notes (Signed)
Patient educated about breastfeeding babies every 2-3 hours. Family into room. Patient asked to wait a little while longer to feed babies.

## 2016-10-31 NOTE — Progress Notes (Signed)
Postpartum Day # 1: Cesarean Delivery of di-di twins with BTL  Subjective: Patient reports tolerating PO, + flatus and no problems voiding.    Objective: Vital signs in last 24 hours: Temp:  [97.6 F (36.4 C)-98.1 F (36.7 C)] 97.6 F (36.4 C) (09/08 0258) Pulse Rate:  [63-71] 63 (09/08 0820) Resp:  [16-18] 18 (09/08 0820) BP: (102-123)/(54-76) 115/74 (09/08 0820) SpO2:  [98 %-100 %] 99 % (09/08 0820)  Physical Exam:  General: alert and no distress Lungs: clear to auscultation bilaterally Breasts: normal appearance, no masses or tenderness Heart: regular rate and rhythm, S1, S2 normal, no murmur, click, rub or gallop Abdomen: soft, non-tender; bowel sounds normal; no masses,  no organomegaly Pelvis: Lochia appropriate, Uterine Fundus firm, Incision: healing well, no significant drainage, no dehiscence, no significant erythema Extremities: DVT Evaluation: No evidence of DVT seen on physical exam. Negative Homan's sign. No cords or calf tenderness. No significant calf/ankle edema.   Recent Labs  10/29/16 1014 10/31/16 0459  HGB 12.1 10.6*  HCT 34.4* 30.7*    Assessment/Plan: Status post Cesarean section. Doing well postoperatively.  Advance diet as tolerated Continue PO pain management Continue current care. Contraception: Intra-operative BTL Circumcision: desired. Will be performed as outpatient.  Dispo: likely d/c home in 1 -2 days.   Hildred LaserAnika Charlestine Rookstool, MD [facility]

## 2016-10-31 NOTE — Anesthesia Postprocedure Evaluation (Signed)
Anesthesia Post Note  Patient: Tammy JettyStephanie L Waskey  Procedure(s) Performed: Procedure(s): REPEAT CESAREAN SECTION MULTI-GESTATIONAL WITH TUBAL  Patient location during evaluation: [facility] Anesthesia Type: Spinal Level of consciousness: oriented and awake and alert Pain management: pain level controlled Vital Signs Assessment: post-procedure vital signs reviewed and stable Respiratory status: spontaneous breathing, respiratory function stable and patient connected to nasal cannula oxygen Cardiovascular status: blood pressure returned to baseline and stable Postop Assessment: no headache, no backache and patient able to bend at knees (able to ambulate) Anesthetic complications: no     Last Vitals:  Vitals:   10/31/16 0258 10/31/16 0820  BP: (!) 102/57 115/74  Pulse: 71 63  Resp: 18 18  Temp: 36.4 C   SpO2: 98% 99%    Last Pain:  Vitals:   10/31/16 0300  TempSrc:   PainSc: 7                  Cleda MccreedyJoseph K Masae Lukacs

## 2016-11-01 MED ORDER — DOCUSATE SODIUM 100 MG PO CAPS: 100.0000 mg | ORAL_CAPSULE | Freq: Two times a day (BID) | ORAL | 2 refills | Status: DC

## 2016-11-01 MED ORDER — VITAMIN D3 125 MCG (5000 UT) PO CAPS: 1.0000 | ORAL_CAPSULE | Freq: Every day | ORAL | 2 refills | Status: DC

## 2016-11-01 MED ORDER — CITRANATAL BLOOM 90-1 MG PO TABS: 1.0000 | ORAL_TABLET | Freq: Every day | ORAL | 11 refills | Status: DC

## 2016-11-01 MED ORDER — OXYCODONE-ACETAMINOPHEN 5-325 MG PO TABS: 2.0000 | ORAL_TABLET | ORAL | 0 refills | Status: DC | PRN

## 2016-11-01 MED ORDER — IBUPROFEN 800 MG PO TABS: 800.0000 mg | ORAL_TABLET | Freq: Four times a day (QID) | ORAL | 0 refills | Status: DC

## 2016-11-01 NOTE — Discharge Summary (Signed)
Physician Obstetric Discharge Summary  Patient ID: Tammy Leon MRN: [id number] DOB/AGE: 1986/02/16 31 y.o.  Reason for Admission: cesarean section Prenatal Procedures: NST and ultrasound Intrapartum Procedures: cesarean: low cervical, transverse and tubal ligation Postpartum Procedures: none Complications-Operative and Postpartum: none    Tammy Leon, BoyA Tammy Leon [[id number]][[id number]]  Delivery Note At 8:40 AM two viable and healthy female was delivered via C-Section, Low Transverse   Anesthesia:  spinal    Mom to postpartum.  Baby to Couplet care / Skin to Skin.  Melody N Shambley 11/01/2016, 10:49 AM   .    H/H:  Lab Results  Component Value Date/Time   HGB 10.6 (L) 10/31/2016 04:59 AM   HGB 10.6 (L) 08/14/2016 02:26 PM   HCT 30.7 (L) 10/31/2016 04:59 AM   HCT 31.7 (L) 08/14/2016 02:26 PM    Brief Hospital Course: Tammy Leon is a [id number] who underwent cesarean section on 10/30/2016.  Patient had an uncomplicated surgery; for further details of this surgery, please refer to the operative note.  Patient had an uncomplicated postpartum course.  By time of discharge on POD#2/PPD#2, her pain was controlled on oral pain medications; she had appropriate lochia and was ambulating, voiding without difficulty, tolerating regular diet and passing flatus.   She was deemed stable for discharge to home.    Discharge Diagnoses: twin delivery via repeat LTCS and BTL  Discharge Information: Date: 11/01/2016 Activity: pelvic rest Diet: routine Baby feeding: plans to breastfeed Contraception: bilateral tubal ligation Medications: PNV, Tylenol #3, Ibuprofen and Colace Discharged Condition: good Instructions: refer to practice specific booklet Discharge to: home  Signed: Purcell NailsMelody N Shambley, CNM 11/01/2016, 10:49 AM

## 2016-11-01 NOTE — Progress Notes (Signed)
D/C home to car via auxiliary in wheelchair.  

## 2016-11-01 NOTE — Progress Notes (Signed)
Pt and family watching Period of Purple Cry.

## 2016-11-01 NOTE — Progress Notes (Signed)
D/C instructions provided, pt states understanding, aware of follow up appt.  Prescription given to pt.   

## 2016-11-02 LAB — SURGICAL PATHOLOGY

## 2016-11-05 ENCOUNTER — Ambulatory Visit (INDEPENDENT_AMBULATORY_CARE_PROVIDER_SITE_OTHER): Payer: Medicaid Other | Admitting: Certified Nurse Midwife

## 2016-11-05 ENCOUNTER — Encounter: Payer: Medicaid Other | Admitting: Obstetrics and Gynecology

## 2016-11-05 ENCOUNTER — Encounter: Payer: Self-pay | Admitting: Certified Nurse Midwife

## 2016-11-05 VITALS — BP 106/70 | HR 71 | Wt 177.5 lb

## 2016-11-05 DIAGNOSIS — Z09 Encounter for follow-up examination after completed treatment for conditions other than malignant neoplasm: Secondary | ICD-10-CM | POA: Diagnosis not present

## 2016-11-05 NOTE — Patient Instructions (Signed)
Incision Care, Adult An incision is a surgical cut that is made through your skin. Most incisions are closed after surgery. Your incision may be closed with stitches (sutures), staples, skin glue, or adhesive strips. You may need to return to your health care provider to have sutures or staples removed. This may occur several days to several weeks after your surgery. The incision needs to be cared for properly to prevent infection. How to care for your incision Incision care   Follow instructions from your health care provider about how to take care of your incision. Make sure you: ? Wash your hands with soap and water before you change the bandage (dressing). If soap and water are not available, use hand sanitizer. ? Change your dressing as told by your health care provider. ? Leave sutures, skin glue, or adhesive strips in place. These skin closures may need to stay in place for 2 weeks or longer. If adhesive strip edges start to loosen and curl up, you may trim the loose edges. Do not remove adhesive strips completely unless your health care provider tells you to do that.  Check your incision area every day for signs of infection. Check for: ? More redness, swelling, or pain. ? More fluid or blood. ? Warmth. ? Pus or a bad smell.  Ask your health care provider how to clean the incision. This may include: ? Using mild soap and water. ? Using a clean towel to pat the incision dry after cleaning it. ? Applying a cream or ointment. Do this only as told by your health care provider. ? Covering the incision with a clean dressing.  Ask your health care provider when you can leave the incision uncovered.  Do not take baths, swim, or use a hot tub until your health care provider approves. Ask your health care provider if you can take showers. You may only be allowed to take sponge baths for bathing. Medicines  If you were prescribed an antibiotic medicine, cream, or ointment, take or apply the  antibiotic as told by your health care provider. Do not stop taking or applying the antibiotic even if your condition improves.  Take over-the-counter and prescription medicines only as told by your health care provider. General instructions  Limit movement around your incision to improve healing. ? Avoid straining, lifting, or exercise for the first month, or for as long as told by your health care provider. ? Follow instructions from your health care provider about returning to your normal activities. ? Ask your health care provider what activities are safe.  Protect your incision from the sun when you are outside for the first 6 months, or for as long as told by your health care provider. Apply sunscreen around the scar or cover it up.  Keep all follow-up visits as told by your health care provider. This is important. Contact a health care provider if:  Your have more redness, swelling, or pain around the incision.  You have more fluid or blood coming from the incision.  Your incision feels warm to the touch.  You have pus or a bad smell coming from the incision.  You have a fever or shaking chills.  You are nauseous or you vomit.  You are dizzy.  Your sutures or staples come undone. Get help right away if:  You have a red streak coming from your incision.  Your incision bleeds through the dressing and the bleeding does not stop with gentle pressure.  The edges of   your incision open up and separate.  You have severe pain.  You have a rash.  You are confused.  You faint.  You have trouble breathing and a fast heartbeat. This information is not intended to replace advice given to you by your health care provider. Make sure you discuss any questions you have with your health care provider. Document Released: 08/29/2004 Document Revised: 10/18/2015 Document Reviewed: 08/28/2015 Elsevier Interactive Patient Education  2018 Elsevier Inc.  

## 2016-11-05 NOTE — Progress Notes (Signed)
    OBSTETRICS/GYNECOLOGY POST-OPERATIVE CLINIC VISIT  Subjective:     Tammy Leon is a 31 y.o. female who presents to the clinic 1 weeks status post Repeat low transverse Cesarean Section with bilateral tubal ligation Eating a regular diet without difficulty. Bowel movements are normal. Pain is controlled with current analgesics. Medications being used: acetaminophen, ibuprofen (OTC) and perscriped narcotic analgesics .  The following portions of the patient's history were reviewed and updated as appropriate: allergies, current medications, past family history, past medical history, past social history, past surgical history and problem list.  Review of Systems Pertinent items are noted in HPI.    Objective:    BP 106/70   Pulse 71   Wt 177 lb 8 oz (80.5 kg)   BMI 32.47 kg/m  General:  alert and no distress  Abdomen: soft, bowel sounds active, non-tender  Incision:   healing well, no drainage, no erythema, no hernia, no seroma, no swelling, no dehiscence, incision well approximated    Pathology:    Assessment:    Doing well postoperatively.   Plan:   1. Continue any current medications. 2. Wound care discussed. 3. Operative findings again reviewed. Pathology report discussed. 4. Activity restrictions: no lifting more than 10 pounds 5. Anticipated return to work: 4 weeks. 6. Follow up: 5 weeks for PPV    Doreene Burkehompson, Allene Furuya, CNM [facility]

## 2016-11-16 ENCOUNTER — Telehealth: Payer: Self-pay | Admitting: Obstetrics and Gynecology

## 2016-11-16 NOTE — Telephone Encounter (Signed)
Pt LVM requesting a nurse return her call b/c she has questions about her c section incision. Please advise. Thanks TNP

## 2016-11-17 NOTE — Telephone Encounter (Signed)
Called pt she states that she is concerned about her incision. Pt states that yesterday the incision became very hard on the right side and was very painful. Pt states that since then the wound has reopened and is now less painful but still tender and red. Advised pt that she needs to be seen, pt states that she cannot come today as she does not have help with twin infants. Pt states she can come tomorrow. Pt added to schedule. Advised on s&s to watch for that would warrant ED visit or being seen sooner.

## 2016-11-18 ENCOUNTER — Ambulatory Visit (INDEPENDENT_AMBULATORY_CARE_PROVIDER_SITE_OTHER): Payer: Medicaid Other | Admitting: Obstetrics and Gynecology

## 2016-11-18 ENCOUNTER — Encounter: Payer: Self-pay | Admitting: Obstetrics and Gynecology

## 2016-11-18 VITALS — BP 100/64 | HR 76 | Ht 62.0 in | Wt 169.8 lb

## 2016-11-18 DIAGNOSIS — Z4889 Encounter for other specified surgical aftercare: Secondary | ICD-10-CM

## 2016-11-18 DIAGNOSIS — Z98891 History of uterine scar from previous surgery: Secondary | ICD-10-CM

## 2016-11-18 NOTE — Progress Notes (Signed)
    OBSTETRICS AND GYNECOLOGY CLINIC PROGRESS NOTE Subjective:     Tammy Leon is a 31 y.o. [id number] female who presents today for wound check.  She is ~ 3 weeks postpartum s/p repeat C-section for di-di twin gestation and prior history of C-section x 1.  Patient notes that she had an area on the right side of her incision that opened up and had some drainage several days ago. Notes that there is a "knot" under the incision, that is tender. States that it was red several days ago, and was concerned it was infected.   Objective:    BP 100/64 (BP Location: Left Arm, Patient Position: Sitting, Cuff Size: Normal)   Pulse 76   Ht  (1.575 m)   Wt 169 lb 12.8 oz (77 kg)   Breastfeeding? Yes   BMI 31.06 kg/m    General Appearance:  Alert, no distress  Wound:   wound margins intact and healing well.  No signs of infection.  Bilateral nodules noted at lateral edges of incision, non-tender.     Assessment:    Wound check. Cares provided were visual inspection   Plan:    1. Discussed appropriate home care of this wound.  Given reassurance that wound was healing well, without infection. Advised that nodules would resolve over time.  2. Patient instructions were given. 3. Follow up: 3 weeks for final post-op check.     Hildred Laser, MD [facility]

## 2016-12-10 ENCOUNTER — Encounter: Payer: Self-pay | Admitting: Certified Nurse Midwife

## 2016-12-10 ENCOUNTER — Ambulatory Visit (INDEPENDENT_AMBULATORY_CARE_PROVIDER_SITE_OTHER): Payer: Medicaid Other | Admitting: Certified Nurse Midwife

## 2016-12-10 NOTE — Progress Notes (Signed)
Subjective:    Tammy Leon is a 31 y.o. 253P2012 Caucasian female who presents for a postpartum visit. She is 6 weeks postpartum following a repeat cesarean section, low transverse incision at 38+0 gestational weeks for twins. Anesthesia: spinal. I have fully reviewed the prenatal and intrapartum course.   Postpartum course has been uncomplicated. Babies course has been uncomplicated. Baby is feeding by breast with supplementation. Bleeding no bleeding. Bowel function is normal. Bladder function is normal.   Patient is sexually active. Last sexual activity: a few days ago. Contraception method is tubal ligation. Postpartum depression screening: negative. Score 4.  Last pap 04/27/2016 and was negative.  Patient reports increased anxiety associated with milk let down when breastfeeding or pumping. Questions treatment options for possible Dysphoric Milk Ejection Reflex.   The following portions of the patient's history were reviewed and updated as appropriate: allergies, current medications, past medical history, past surgical history and problem list.  Review of Systems  Pertinent items are noted in HPI.   Objective:   BP 106/72   Pulse 74   Wt 175 lb 12.8 oz (79.7 kg)   Breastfeeding? Yes Comment: and bottle  BMI 32.15 kg/m   General:  alert, cooperative and no distress   Breasts:  deferred, no complaints  Lungs: clear to auscultation bilaterally  Heart:  regular rate and rhythm  Abdomen: soft, nontender   Vulva: normal  Vagina: normal vagina  Cervix:  closed  Corpus: Well-involuted  Adnexa:  Non-palpable     Depression screen PHQ 2/9 12/10/2016  Decreased Interest 0  Down, Depressed, Hopeless 1  PHQ - 2 Score 1  Altered sleeping 0  Tired, decreased energy 3  Change in appetite 0  Feeling bad or failure about yourself  0  Trouble concentrating 0  Moving slowly or fidgety/restless 0  Suicidal thoughts 0  PHQ-9 Score 4  Difficult doing work/chores Somewhat difficult      Assessment:   Postpartum exam Six (6) wks s/p repeat low cesarean section with bilateral tubal ligation Breastfeeding with supplementation DepressDion screening  Plan:   Discussed management techniques and treatment options for Dysphoric Milk Ejection Reflex including positive self talk, essential oils, exercise, rest, balanced diet, decreased caffeine and sugar, not smoking, and/or Wellbutrin. Patient would like to start with positive self talk and essential oils.    Reviewed red flag symptoms and when to call.   Follow up in: 6 months for Annual exam or earlier if needed.    Tammy Leon, CNM

## 2016-12-10 NOTE — Patient Instructions (Signed)
Preventive Care 18-39 Years, Female Preventive care refers to lifestyle choices and visits with your health care provider that can promote health and wellness. What does preventive care include?  A yearly physical exam. This is also called an annual well check.  Dental exams once or twice a year.  Routine eye exams. Ask your health care provider how often you should have your eyes checked.  Personal lifestyle choices, including: ? Daily care of your teeth and gums. ? Regular physical activity. ? Eating a healthy diet. ? Avoiding tobacco and drug use. ? Limiting alcohol use. ? Practicing safe sex. ? Taking vitamin and mineral supplements as recommended by your health care provider. What happens during an annual well check? The services and screenings done by your health care provider during your annual well check will depend on your age, overall health, lifestyle risk factors, and family history of disease. Counseling Your health care provider may ask you questions about your:  Alcohol use.  Tobacco use.  Drug use.  Emotional well-being.  Home and relationship well-being.  Sexual activity.  Eating habits.  Work and work Statistician.  Method of birth control.  Menstrual cycle.  Pregnancy history.  Screening You may have the following tests or measurements:  Height, weight, and BMI.  Diabetes screening. This is done by checking your blood sugar (glucose) after you have not eaten for a while (fasting).  Blood pressure.  Lipid and cholesterol levels. These may be checked every 5 years starting at age 66.  Skin check.  Hepatitis C blood test.  Hepatitis B blood test.  Sexually transmitted disease (STD) testing.  BRCA-related cancer screening. This may be done if you have a family history of breast, ovarian, tubal, or peritoneal cancers.  Pelvic exam and Pap test. This may be done every 3 years starting at age 40. Starting at age 59, this may be done every 5  years if you have a Pap test in combination with an HPV test.  Discuss your test results, treatment options, and if necessary, the need for more tests with your health care provider. Vaccines Your health care provider may recommend certain vaccines, such as:  Influenza vaccine. This is recommended every year.  Tetanus, diphtheria, and acellular pertussis (Tdap, Td) vaccine. You may need a Td booster every 10 years.  Varicella vaccine. You may need this if you have not been vaccinated.  HPV vaccine. If you are 69 or younger, you may need three doses over 6 months.  Measles, mumps, and rubella (MMR) vaccine. You may need at least one dose of MMR. You may also need a second dose.  Pneumococcal 13-valent conjugate (PCV13) vaccine. You may need this if you have certain conditions and were not previously vaccinated.  Pneumococcal polysaccharide (PPSV23) vaccine. You may need one or two doses if you smoke cigarettes or if you have certain conditions.  Meningococcal vaccine. One dose is recommended if you are age 27-21 years and a first-year college student living in a residence hall, or if you have one of several medical conditions. You may also need additional booster doses.  Hepatitis A vaccine. You may need this if you have certain conditions or if you travel or work in places where you may be exposed to hepatitis A.  Hepatitis B vaccine. You may need this if you have certain conditions or if you travel or work in places where you may be exposed to hepatitis B.  Haemophilus influenzae type b (Hib) vaccine. You may need this if  you have certain risk factors.  Talk to your health care provider about which screenings and vaccines you need and how often you need them. This information is not intended to replace advice given to you by your health care provider. Make sure you discuss any questions you have with your health care provider. Document Released: 04/07/2001 Document Revised: 10/30/2015  Document Reviewed: 12/11/2014 Elsevier Interactive Patient Education  2017 [employer].

## 2020-01-06 ENCOUNTER — Ambulatory Visit: Payer: Self-pay | Attending: Internal Medicine

## 2020-01-06 DIAGNOSIS — Z23 Encounter for immunization: Secondary | ICD-10-CM

## 2020-01-06 NOTE — Progress Notes (Signed)
° °  Covid-19 Vaccination Clinic  Name:  Desma Wilkowski    MRN: [id number] DOB: 1986/01/28  01/06/2020  Ms. Pund was observed post Covid-19 immunization for 15 minutes without incident. She was provided with Vaccine Information Sheet and instruction to access the V-Safe system.   Ms. Casamento was instructed to call 911 with any severe reactions post vaccine:  Difficulty breathing   Swelling of face and throat   A fast heartbeat   A bad rash all over body   Dizziness and weakness   Immunizations Administered    Name Date Dose VIS Date Route   Pfizer COVID-19 Vaccine 01/06/2020 11:45 AM 0.3 mL 12/13/2019 Intramuscular   Manufacturer: [employer], [employer]   Lot: [id number]   NDC: [address]-2

## 2020-08-14 ENCOUNTER — Encounter: Payer: Self-pay | Admitting: Certified Nurse Midwife

## 2021-01-15 ENCOUNTER — Ambulatory Visit (INDEPENDENT_AMBULATORY_CARE_PROVIDER_SITE_OTHER): Payer: BC Managed Care – PPO | Admitting: Physician Assistant

## 2021-01-15 ENCOUNTER — Other Ambulatory Visit: Payer: Self-pay | Admitting: *Deleted

## 2021-01-15 ENCOUNTER — Other Ambulatory Visit: Payer: Self-pay

## 2021-01-15 ENCOUNTER — Encounter: Payer: Self-pay | Admitting: Physician Assistant

## 2021-01-15 VITALS — BP 126/73 | HR 97 | Temp 98.2°F | Resp 16 | Ht 62.0 in | Wt 168.0 lb

## 2021-01-15 DIAGNOSIS — Z833 Family history of diabetes mellitus: Secondary | ICD-10-CM

## 2021-01-15 DIAGNOSIS — Z8342 Family history of familial hypercholesterolemia: Secondary | ICD-10-CM

## 2021-01-15 DIAGNOSIS — Z862 Personal history of diseases of the blood and blood-forming organs and certain disorders involving the immune mechanism: Secondary | ICD-10-CM | POA: Diagnosis not present

## 2021-01-15 DIAGNOSIS — Z23 Encounter for immunization: Secondary | ICD-10-CM

## 2021-01-15 DIAGNOSIS — Z0000 Encounter for general adult medical examination without abnormal findings: Secondary | ICD-10-CM

## 2021-01-15 NOTE — Progress Notes (Signed)
Patient ID: Tammy Leon, female   DOB: 1985/09/03, 35 y.o.   MRN: [id number]   Tammy Leon, is a 35 y.o. female  HER:[id number]  EHU:[id number]  DOB - [birth year]  Chief Complaint  Patient presents with   Establish Care       Subjective:   Tammy Leon is a 35 y.o. female here today to establish care. Last pap 4 years before the birth of twin sons.  Also has a 65 yr old son.  +FH of htn, diabetes, and high cholesterol.  She had BTL after last c-sxn 4 years ago.     Patient has No headache, No chest pain, No abdominal pain - No Nausea, No new weakness tingling or numbness, No Cough - SOB.  No problems updated.  ALLERGIES: Allergies  Allergen Reactions   Morphine And Related Itching    PAST MEDICAL HISTORY: Past Medical History:  Diagnosis Date   GERD (gastroesophageal reflux disease)    Restless leg syndrome     MEDICATIONS AT HOME: Prior to Admission medications   Medication Sig Start Date End Date Taking? Authorizing Provider  Ascorbic Acid (VITAMIN C PO) Take 1 tablet by mouth daily.    [provider]  Cholecalciferol (VITAMIN D3) 5000 units CAPS Take 1 capsule (5,000 Units total) by mouth daily. 11/01/16   Shambley, Melody N, CNM  ibuprofen (ADVIL,MOTRIN) 800 MG tablet Take 1 tablet (800 mg total) by mouth every 6 (six) hours. 11/01/16   Shambley, Melody N, CNM    ROS: Neg HEENT Neg resp Neg cardiac Neg GI Neg GU Neg MS Neg psych Neg neuro  Objective:   Vitals:   01/15/21 1341  BP: 126/73  Pulse: 97  Resp: 16  Temp: 98.2 F (36.8 C)  TempSrc: Oral  Weight: 168 lb (76.2 kg)  Height: 5\' 2"  (1.575 m)   Exam General appearance : Awake, alert, not in any distress. Speech Clear. Not toxic looking HEENT: Atraumatic and Normocephalic Neck: Supple, no JVD. No cervical lymphadenopathy.  Chest: Good air entry bilaterally, CTAB.  No rales/rhonchi/wheezing CVS: S1 S2 regular, no murmurs.  Extremities: B/L Lower Ext shows no edema, both legs  are warm to touch Neurology: Awake alert, and oriented X 3, CN II-XII intact, Non focal Skin: No Rash  Data Review Lab Results  Component Value Date   HGBA1C 5.0 04/22/2016    Assessment & Plan   1. H/O iron deficiency anemia Only in pregnancy - CBC with Differential/Platelet  2. Family history of high cholesterol Heart healthy diet and exercise discussed - Lipid panel  3. Family history of diabetes mellitus I have had a lengthy discussion and provided education about insulin resistance and the intake of too much sugar/refined carbohydrates.  I have advised the patient to work at a goal of eliminating sugary drinks, candy, desserts, sweets, refined sugars, processed foods, and white carbohydrates.  The patient expresses understanding.  - Hemoglobin A1c  4. Encounter for medical examination to establish care    Patient have been counseled extensively about nutrition and exercise. Other issues discussed during this visit include: low cholesterol diet, weight control and daily exercise, foot care, annual eye examinations at Ophthalmology, importance of adherence with medications and regular follow-up. We also discussed long term complications of uncontrolled diabetes and hypertension.   Return in about 3 months (around 04/17/2021) for pap and assign PCP.  The patient was given clear instructions to go to ER or return to medical center if symptoms don't improve, worsen or new problems  develop. The patient verbalized understanding. The patient was told to call to get lab results if they haven't heard anything in the next week.      Tammy Co, PA-C [facility] [place], [place] [phone]   01/15/2021, 1:53 PM

## 2021-01-15 NOTE — Progress Notes (Signed)
Patient is here to est care with no other concerns for provider.

## 2021-01-15 NOTE — Addendum Note (Signed)
Addended by: Kieth Brightly on: 01/15/2021 04:02 PM   Modules accepted: Orders

## 2021-02-04 ENCOUNTER — Ambulatory Visit (INDEPENDENT_AMBULATORY_CARE_PROVIDER_SITE_OTHER): Payer: BC Managed Care – PPO | Admitting: Family Medicine

## 2021-02-04 ENCOUNTER — Other Ambulatory Visit: Payer: Self-pay

## 2021-02-04 ENCOUNTER — Encounter: Payer: Self-pay | Admitting: Family Medicine

## 2021-02-04 ENCOUNTER — Other Ambulatory Visit (HOSPITAL_COMMUNITY)
Admission: RE | Admit: 2021-02-04 | Discharge: 2021-02-04 | Disposition: A | Discharge status: Home / Self Care | Payer: BC Managed Care – PPO | Source: Ambulatory Visit | Attending: Family Medicine | Admitting: Family Medicine

## 2021-02-04 VITALS — BP 120/80 | HR 91 | Resp 16 | Wt 170.0 lb

## 2021-02-04 DIAGNOSIS — Z131 Encounter for screening for diabetes mellitus: Secondary | ICD-10-CM

## 2021-02-04 DIAGNOSIS — Z01419 Encounter for gynecological examination (general) (routine) without abnormal findings: Secondary | ICD-10-CM | POA: Diagnosis not present

## 2021-02-04 DIAGNOSIS — Z13220 Encounter for screening for lipoid disorders: Secondary | ICD-10-CM

## 2021-02-04 DIAGNOSIS — Z1329 Encounter for screening for other suspected endocrine disorder: Secondary | ICD-10-CM

## 2021-02-04 DIAGNOSIS — Z1159 Encounter for screening for other viral diseases: Secondary | ICD-10-CM

## 2021-02-04 NOTE — Progress Notes (Signed)
New Patient Office Visit  Subjective:  Patient ID: Tammy Leon, female    DOB: 1986-01-15  Age: 35 y.o. MRN: [id number]  CC:  Chief Complaint  Patient presents with   Annual Exam    HPI Tammy Leon presents for routine annual exam. Denies acute complaints or concerns.   Past Medical History:  Diagnosis Date   GERD (gastroesophageal reflux disease)    Restless leg syndrome     Past Surgical History:  Procedure Laterality Date   CESAREAN SECTION  08/27/2011   Procedure: CESAREAN SECTION;  Surgeon: Logan Bores, MD;  Location: [facility];  Service: Gynecology;  Laterality: N/A;   CESAREAN SECTION MULTI-GESTATIONAL WITH TUBAL  10/30/2016   Procedure: REPEAT CESAREAN SECTION MULTI-GESTATIONAL WITH TUBAL;  Surgeon: Rubie Maid, MD;  Location: [facility];  Service: Obstetrics;;    Family History  Problem Relation Age of Onset   Diabetes Father    Asthma Father    Diabetes Maternal Grandmother    Cancer Paternal Grandmother        breast   Diabetes Paternal Grandmother    Stroke Paternal Grandmother    Hypertension Paternal Grandfather    Diabetes Paternal Grandfather    Other Neg Hx     Social History   Socioeconomic History   Marital status: Married    Spouse name: Not on file   Number of children: Not on file   Years of education: Not on file   Highest education level: Not on file  Occupational History   Not on file  Tobacco Use   Smoking status: Every Day    Packs/day: 0.20    Types: Cigarettes   Smokeless tobacco: Never   Tobacco comments:    trying to quit, decreasing cigarrettes  Vaping Use   Vaping Use: Never used  Substance and Sexual Activity   Alcohol use: No    Comment: before pregnancy   Drug use: Yes    Types: Marijuana    Comment: December 2017 last time   Sexual activity: Not Currently    Partners: Male    Birth control/protection: None  Other Topics Concern   Not on file  Social History Narrative   ** Merged History Encounter  **       Social Determinants of Health   Financial Resource Strain: Not on file  Food Insecurity: Not on file  Transportation Needs: Not on file  Physical Activity: Not on file  Stress: Not on file  Social Connections: Not on file  Intimate Partner Violence: Not on file    ROS Review of Systems  All other systems reviewed and are negative.  Objective:   Today's Vitals: BP 120/80   Pulse 91   Resp 16   Wt 170 lb (77.1 kg)   BMI 31.09 kg/m   Physical Exam Vitals and nursing note reviewed.  Constitutional:      General: She is not in acute distress. HENT:     Head: Normocephalic and atraumatic.     Right Ear: Tympanic membrane, ear canal and external ear normal.     Left Ear: Tympanic membrane, ear canal and external ear normal.     Nose: Nose normal.     Mouth/Throat:     Mouth: Mucous membranes are moist.     Pharynx: Oropharynx is clear.  Eyes:     Conjunctiva/sclera: Conjunctivae normal.     Pupils: Pupils are equal, round, and reactive to light.  Neck:     Thyroid: No  thyromegaly.  Cardiovascular:     Rate and Rhythm: Normal rate and regular rhythm.     Heart sounds: Normal heart sounds. No murmur heard. Pulmonary:     Effort: Pulmonary effort is normal. No respiratory distress.     Breath sounds: Normal breath sounds.  Abdominal:     General: There is no distension.     Palpations: Abdomen is soft. There is no mass.     Tenderness: There is no abdominal tenderness.  Musculoskeletal:        General: Normal range of motion.     Cervical back: Normal range of motion and neck supple.  Skin:    General: Skin is warm and dry.  Neurological:     General: No focal deficit present.     Mental Status: She is alert and oriented to person, place, and time.  Psychiatric:        Mood and Affect: Mood normal.        Behavior: Behavior normal.    Assessment & Plan:   1. Well woman exam Unremarkable exam. Labs ordered.  - CMP14+EGFR - CBC with Differential -  Cytology - PAP - Cervicovaginal ancillary only  2. Need for hepatitis C screening test  - Hepatitis C antibody  3. Screening for thyroid disorder  - TSH  4. Screening for diabetes mellitus (DM)  - Hemoglobin A1c  5. Screening for lipid disorders  - Lipid panel    Follow-up: No follow-ups on file.   Becky Sax, MD

## 2021-02-05 ENCOUNTER — Encounter: Payer: Self-pay | Admitting: Family Medicine

## 2021-02-05 LAB — LIPID PANEL
Chol/HDL Ratio: 3.8 ratio (ref 0.0–4.4)
Cholesterol, Total: 150 mg/dL (ref 100–199)
HDL: 39 mg/dL — ABNORMAL LOW (ref >39)
LDL Chol Calc (NIH): 78 mg/dL (ref 0–99)
Triglycerides: 193 mg/dL — ABNORMAL HIGH (ref 0–149)
VLDL Cholesterol Cal: 33 mg/dL (ref 5–40)

## 2021-02-05 LAB — CMP14+EGFR
ALT: 31 IU/L (ref 0–32)
AST: 26 IU/L (ref 0–40)
Albumin/Globulin Ratio: 1.8 (ref 1.2–2.2)
Albumin: 4.5 g/dL (ref 3.8–4.8)
Alkaline Phosphatase: 70 IU/L (ref 44–121)
BUN/Creatinine Ratio: 16 (ref 9–23)
BUN: 11 mg/dL (ref 6–20)
Bilirubin Total: 0.2 mg/dL (ref 0.0–1.2)
CO2: 25 mmol/L (ref 20–29)
Calcium: 9.1 mg/dL (ref 8.7–10.2)
Chloride: 99 mmol/L (ref 96–106)
Creatinine, Ser: 0.68 mg/dL (ref 0.57–1.00)
Globulin, Total: 2.5 g/dL (ref 1.5–4.5)
Glucose: 67 mg/dL — ABNORMAL LOW (ref 70–99)
Potassium: 5 mmol/L (ref 3.5–5.2)
Sodium: 137 mmol/L (ref 134–144)
Total Protein: 7 g/dL (ref 6.0–8.5)
eGFR: 117 mL/min/{1.73_m2} (ref >59)

## 2021-02-05 LAB — CBC WITH DIFFERENTIAL/PLATELET
Basophils Absolute: 0.1 10*3/uL (ref 0.0–0.2)
Basos: 1 %
EOS (ABSOLUTE): 0.3 10*3/uL (ref 0.0–0.4)
Eos: 4 %
Hematocrit: 40.9 % (ref 34.0–46.6)
Hemoglobin: 13.9 g/dL (ref 11.1–15.9)
Immature Grans (Abs): 0 10*3/uL (ref 0.0–0.1)
Immature Granulocytes: 0 %
Lymphocytes Absolute: 2.3 10*3/uL (ref 0.7–3.1)
Lymphs: 24 %
MCH: 32.9 pg (ref 26.6–33.0)
MCHC: 34 g/dL (ref 31.5–35.7)
MCV: 97 fL (ref 79–97)
Monocytes Absolute: 0.5 10*3/uL (ref 0.1–0.9)
Monocytes: 6 %
Neutrophils Absolute: 6.2 10*3/uL (ref 1.4–7.0)
Neutrophils: 65 %
Platelets: 341 10*3/uL (ref 150–450)
RBC: 4.23 x10E6/uL (ref 3.77–5.28)
RDW: 12 % (ref 11.7–15.4)
WBC: 9.5 10*3/uL (ref 3.4–10.8)

## 2021-02-05 LAB — HEPATITIS C ANTIBODY: Hep C Virus Ab: <0.1 s/co ratio (ref 0.0–0.9)

## 2021-02-05 LAB — TSH: TSH: 2.57 u[IU]/mL (ref 0.450–4.500)

## 2021-02-05 LAB — HEMOGLOBIN A1C

## 2021-02-06 LAB — SPECIMEN STATUS REPORT

## 2021-02-06 LAB — HEMOGLOBIN A1C
Est. average glucose Bld gHb Est-mCnc: 105 mg/dL
Hgb A1c MFr Bld: 5.3 % (ref 4.8–5.6)

## 2021-02-06 LAB — CERVICOVAGINAL ANCILLARY ONLY
Bacterial Vaginitis (gardnerella): NEGATIVE
Candida Glabrata: NEGATIVE
Candida Vaginitis: POSITIVE — AB
Chlamydia: NEGATIVE
Comment: NEGATIVE
Comment: NEGATIVE
Comment: NEGATIVE
Comment: NEGATIVE
Comment: NEGATIVE
Comment: NORMAL
Neisseria Gonorrhea: NEGATIVE
Trichomonas: NEGATIVE

## 2021-02-10 ENCOUNTER — Encounter: Payer: Self-pay | Admitting: *Deleted

## 2021-02-10 ENCOUNTER — Encounter (INDEPENDENT_AMBULATORY_CARE_PROVIDER_SITE_OTHER): Payer: Self-pay

## 2021-02-10 LAB — CYTOLOGY - PAP
Chlamydia: NEGATIVE
Comment: NEGATIVE
Comment: NEGATIVE
Comment: NEGATIVE
Comment: NEGATIVE
Comment: NORMAL
Diagnosis: NEGATIVE
HSV1: NEGATIVE
HSV2: NEGATIVE
High risk HPV: NEGATIVE
Neisseria Gonorrhea: NEGATIVE
Trichomonas: NEGATIVE

## 2021-02-10 NOTE — Progress Notes (Signed)
Results were viewed through mychart bu patient

## 2021-02-13 ENCOUNTER — Other Ambulatory Visit: Payer: Self-pay | Admitting: Family Medicine

## 2021-02-13 MED ORDER — METRONIDAZOLE 500 MG PO TABS: 500.0000 mg | ORAL_TABLET | Freq: Two times a day (BID) | ORAL | 0 refills | Status: AC

## 2021-02-19 NOTE — Progress Notes (Signed)
Labs were viewed through my chart by patient

## 2021-02-20 ENCOUNTER — Ambulatory Visit: Payer: Self-pay | Admitting: Family Medicine
